# Patient Record
Sex: Female | Born: 1995 | Race: Black or African American | Hispanic: No | Marital: Single | Smoking: Current every day smoker
Health system: Southern US, Community
[De-identification: ages and names within clinical notes are randomized; demographics above are authoritative.]

## PROBLEM LIST (undated history)

## (undated) ENCOUNTER — Emergency Department (HOSPITAL_COMMUNITY): Payer: Medicaid Other

## (undated) DIAGNOSIS — N63 Unspecified lump in unspecified breast: Secondary | ICD-10-CM

## (undated) DIAGNOSIS — O139 Gestational [pregnancy-induced] hypertension without significant proteinuria, unspecified trimester: Secondary | ICD-10-CM

## (undated) DIAGNOSIS — F32A Depression, unspecified: Secondary | ICD-10-CM

## (undated) DIAGNOSIS — N390 Urinary tract infection, site not specified: Secondary | ICD-10-CM

## (undated) DIAGNOSIS — I10 Essential (primary) hypertension: Secondary | ICD-10-CM

## (undated) HISTORY — PX: NO PAST SURGERIES: SHX2092

## (undated) HISTORY — DX: Unspecified lump in unspecified breast: N63.0

---
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
## 2019-04-08 NOTE — L&D Delivery Note (Signed)
OB/GYN Faculty Practice Delivery Note  Zoraida Havrilla is a 24 y.o. [id number] s/p NSVD at [redacted]w[redacted]d. She was admitted for SROM @ 0230/SOOL.   ROM: 7h 69m with clear fluid GBS Status: Negative Maximum Maternal Temperature: 98.2 degrees Fahrenheit   Labor Progress: . Progressing normally, without augmentation  Delivery Date/Time: 03/19/20 @ 1001 Delivery: Called to room and patient was complete and pushing. Head delivered ROA. No nuchal cord present. Shoulder and body delivered in usual fashion. Infant with spontaneous cry, placed on mother's abdomen, dried and stimulated. Cord clamped x 2 after 1-minute delay, and cut by patient's sister under my direct supervision. Cord blood drawn. Placenta delivered spontaneously with gentle cord traction. Fundus firm with massage and Pitocin. Additional fentanyl given after delivery of placenta for pain control and to be able to reassess perineum. Labia, perineum, vagina, and cervix were inspected, first degree perineal laceration appreciated but was hemostatic and thus not repaired, no other tears appreciated. Excellent hemostasis achieved, infant left skin to skin with mother.  Placenta: complete, three vessel cord appreciated Complications: none Lacerations: First degree perineal laceration, hemostatic, thus not repaired  EBL: 375 mL Analgesia: PRN fentanyl  Postpartum Planning [x ] message to sent to schedule follow-up on 03/19/20 by myself [x ] vaccines - Tdap received, flu declined on 02/20/20, COVID declined, and rubella immune  Infant: boy  APGARs 8,9 at 1 and 5 minutes, respectively  weight pending  Burley Saver, MD [facility] [employer], [place] [facility]

## 2019-07-31 ENCOUNTER — Other Ambulatory Visit: Payer: Self-pay

## 2019-07-31 ENCOUNTER — Encounter (HOSPITAL_COMMUNITY): Payer: Self-pay

## 2019-07-31 DIAGNOSIS — B9689 Other specified bacterial agents as the cause of diseases classified elsewhere: Secondary | ICD-10-CM | POA: Diagnosis not present

## 2019-07-31 DIAGNOSIS — Z3A01 Less than 8 weeks gestation of pregnancy: Secondary | ICD-10-CM | POA: Diagnosis not present

## 2019-07-31 DIAGNOSIS — Z3491 Encounter for supervision of normal pregnancy, unspecified, first trimester: Secondary | ICD-10-CM | POA: Diagnosis not present

## 2019-07-31 DIAGNOSIS — N760 Acute vaginitis: Secondary | ICD-10-CM | POA: Diagnosis not present

## 2019-07-31 DIAGNOSIS — O23591 Infection of other part of genital tract in pregnancy, first trimester: Secondary | ICD-10-CM | POA: Insufficient documentation

## 2019-07-31 LAB — CBC
HCT: 42.9 % (ref 36.0–46.0)
Hemoglobin: 14.2 g/dL (ref 12.0–15.0)
MCH: 31.5 pg (ref 26.0–34.0)
MCHC: 33.1 g/dL (ref 30.0–36.0)
MCV: 95.1 fL (ref 80.0–100.0)
Platelets: 289 10*3/uL (ref 150–400)
RBC: 4.51 MIL/uL (ref 3.87–5.11)
RDW: 12.3 % (ref 11.5–15.5)
WBC: 5.4 10*3/uL (ref 4.0–10.5)
nRBC: 0 % (ref 0.0–0.2)

## 2019-07-31 MED ORDER — SODIUM CHLORIDE 0.9% FLUSH: 3.0000 mL | Freq: Once | INTRAVENOUS | Status: DC

## 2019-07-31 NOTE — ED Triage Notes (Signed)
Pt reports that she is pregnant, but unsure how far along. She complains of abdominal cramping x2 days. Denies bleeding, but reports a malodorous discharge. She is unsure of d/c color. She reports recent unprotected sex with someone that she recently got an STD from. Unsure which STD. Hx of miscarriage in Dec. 2020.

## 2019-08-01 ENCOUNTER — Emergency Department (HOSPITAL_COMMUNITY): Payer: Medicaid - Out of State

## 2019-08-01 ENCOUNTER — Emergency Department (HOSPITAL_COMMUNITY)
Admission: EM | Admit: 2019-08-01 | Discharge: 2019-08-01 | Disposition: A | Discharge status: Home / Self Care | Payer: Medicaid - Out of State | Attending: Emergency Medicine | Admitting: Emergency Medicine

## 2019-08-01 DIAGNOSIS — Z3A01 Less than 8 weeks gestation of pregnancy: Secondary | ICD-10-CM

## 2019-08-01 DIAGNOSIS — B9689 Other specified bacterial agents as the cause of diseases classified elsewhere: Secondary | ICD-10-CM

## 2019-08-01 DIAGNOSIS — R102 Pelvic and perineal pain: Secondary | ICD-10-CM

## 2019-08-01 DIAGNOSIS — N898 Other specified noninflammatory disorders of vagina: Secondary | ICD-10-CM

## 2019-08-01 LAB — URINALYSIS, ROUTINE W REFLEX MICROSCOPIC
Bilirubin Urine: NEGATIVE
Glucose, UA: NEGATIVE mg/dL
Hgb urine dipstick: NEGATIVE
Ketones, ur: NEGATIVE mg/dL
Leukocytes,Ua: NEGATIVE
Nitrite: NEGATIVE
Protein, ur: NEGATIVE mg/dL
Specific Gravity, Urine: 1.018 (ref 1.005–1.030)
pH: 7 (ref 5.0–8.0)

## 2019-08-01 LAB — COMPREHENSIVE METABOLIC PANEL
ALT: 14 U/L (ref 0–44)
AST: 18 U/L (ref 15–41)
Albumin: 3.5 g/dL (ref 3.5–5.0)
Alkaline Phosphatase: 48 U/L (ref 38–126)
Anion gap: 7 (ref 5–15)
BUN: 8 mg/dL (ref 6–20)
CO2: 23 mmol/L (ref 22–32)
Calcium: 9 mg/dL (ref 8.9–10.3)
Chloride: 107 mmol/L (ref 98–111)
Creatinine, Ser: 0.71 mg/dL (ref 0.44–1.00)
GFR calc Af Amer: >60 mL/min (ref >60)
GFR calc non Af Amer: >60 mL/min (ref >60)
Glucose, Bld: 96 mg/dL (ref 70–99)
Potassium: 3.5 mmol/L (ref 3.5–5.1)
Sodium: 137 mmol/L (ref 135–145)
Total Bilirubin: 0.6 mg/dL (ref 0.3–1.2)
Total Protein: 6.5 g/dL (ref 6.5–8.1)

## 2019-08-01 LAB — LIPASE, BLOOD: Lipase: 33 U/L (ref 11–51)

## 2019-08-01 LAB — WET PREP, GENITAL
Sperm: NONE SEEN
Trich, Wet Prep: NONE SEEN
Yeast Wet Prep HPF POC: NONE SEEN

## 2019-08-01 LAB — I-STAT BETA HCG BLOOD, ED (MC, WL, AP ONLY): I-stat hCG, quantitative: >2000 m[IU]/mL — ABNORMAL HIGH (ref <5)

## 2019-08-01 LAB — HCG, QUANTITATIVE, PREGNANCY: hCG, Beta Chain, Quant, S: 14861 m[IU]/mL — ABNORMAL HIGH (ref <5)

## 2019-08-01 MED ORDER — METRONIDAZOLE 500 MG PO TABS
500.0000 mg | ORAL_TABLET | Freq: Two times a day (BID) | ORAL | 0 refills | Status: DC
Start: 2019-08-01 — End: 2020-03-20

## 2019-08-01 MED ORDER — CEFTRIAXONE SODIUM 1 G IJ SOLR
500.0000 mg | Freq: Once | INTRAMUSCULAR | Status: AC
  Administered 2019-08-01: 500 mg via INTRAMUSCULAR
  Filled 2019-08-01: qty 10

## 2019-08-01 MED ORDER — STERILE WATER FOR INJECTION IJ SOLN
INTRAMUSCULAR | Status: AC
  Administered 2019-08-01: 2.1 mL
  Filled 2019-08-01: qty 10

## 2019-08-01 MED ORDER — DOXYCYCLINE HYCLATE 100 MG PO CAPS
100.0000 mg | ORAL_CAPSULE | Freq: Two times a day (BID) | ORAL | 0 refills | Status: DC
Start: 2019-08-01 — End: 2020-03-20

## 2019-08-01 NOTE — ED Notes (Signed)
Pelvic exam set up and ready at bedside 

## 2019-08-01 NOTE — Discharge Instructions (Addendum)
Take Doxycycline twice a day for 2 weeks for Chlamydia Take Flagyl twice a day for one week for one week for bacterial vaginosis Do not have sex for 2 weeks Have all partners tested and treated Please follow up with OBGYN for prenatal care

## 2019-08-01 NOTE — ED Provider Notes (Signed)
[facility]-EMERGENCY DEPT Provider Note   CSN: [id number] Arrival date & time: 07/31/19  2258   History Chief Complaint  Patient presents with  . Abdominal Cramping    Jamie [name] is a 24 y.o. G34P2010 female who presents with pelvic cramping.  Patient states that she had an STD couple weeks ago and was diagnosed with chlamydia in [place].  She is also pregnant but not sure how far along she is.  She estimates that her last menstrual period was about a month ago.  She was treated for chlamydia but had sex with the same partner who gave her chlamydia and he had not been treated.  She states that she has pelvic cramping that shoots down from the mid abdomen to the pelvic area.  She reports associated vaginal discharge with an odor.  She denies fever, or chills.  She is having nausea but no vomiting.  She denies any urinary symptoms.  She reportedly had a pelvic ultrasound while in [place] but states that she was told that it was too early to see an IUP  HPI     History reviewed. No pertinent past medical history.  There are no problems to display for this patient.    The histories are not reviewed yet. Please review them in the "History" navigator section and refresh this SmartLink.   OB History   No obstetric history on file.     History reviewed. No pertinent family history.  Social History   Tobacco Use  . Smoking status: Not on file  Substance Use Topics  . Alcohol use: Not on file  . Drug use: Not on file    Home Medications Prior to Admission medications   Not on File    Allergies    Patient has no known allergies.  Review of Systems   Review of Systems  Constitutional: Negative for chills and fever.  Gastrointestinal: Positive for nausea. Negative for abdominal pain and vomiting.  Genitourinary: Positive for pelvic pain and vaginal discharge. Negative for difficulty urinating, dysuria, flank pain and vaginal bleeding.  All other  systems reviewed and are negative.   Physical Exam Updated Vital Signs BP 125/61 (BP Location: Right Arm)   Pulse 97   Temp 98.4 F (36.9 C) (Oral)   Resp 18   Ht 5\' 9"  (1.753 m)   SpO2 100%   Physical Exam Vitals and nursing note reviewed.  Constitutional:      General: She is not in acute distress.    Appearance: Normal appearance. She is well-developed. She is not ill-appearing.  HENT:     Head: Normocephalic and atraumatic.  Eyes:     General: No scleral icterus.       Right eye: No discharge.        Left eye: No discharge.     Conjunctiva/sclera: Conjunctivae normal.     Pupils: Pupils are equal, round, and reactive to light.  Cardiovascular:     Rate and Rhythm: Normal rate and regular rhythm.  Pulmonary:     Effort: Pulmonary effort is normal. No respiratory distress.     Breath sounds: Normal breath sounds.  Abdominal:     General: There is no distension.     Palpations: Abdomen is soft.     Tenderness: There is no abdominal tenderness.  Genitourinary:    Comments: Pelvic: No inguinal lymphadenopathy or inguinal hernia noted. Normal external genitalia. No pain with speculum insertion. Closed cervical os with normal appearance - no rash or  lesions. Copious yellowish-white discharge coming from cervix. On bimanual examination no CMT but there is mild left sided adnexal tenderness. Chaperone [facility], RN) present during exam.   Musculoskeletal:     Cervical back: Normal range of motion.  Skin:    General: Skin is warm and dry.  Neurological:     Mental Status: She is alert and oriented to person, place, and time.  Psychiatric:        Behavior: Behavior normal.     ED Results / Procedures / Treatments   Labs (all labs ordered are listed, but only abnormal results are displayed) Labs Reviewed  WET PREP, GENITAL - Abnormal; Notable for the following components:      Result Value   Clue Cells Wet Prep HPF POC PRESENT (*)    WBC, Wet Prep HPF POC FEW (*)    All  other components within normal limits  HCG, QUANTITATIVE, PREGNANCY - Abnormal; Notable for the following components:   hCG, Beta Chain, Quant, S 14,861 (*)    All other components within normal limits  I-STAT BETA HCG BLOOD, ED ([facility], [facility], [facility] ONLY) - Abnormal; Notable for the following components:   I-stat hCG, quantitative >2,000.0 (*)    All other components within normal limits  LIPASE, BLOOD  COMPREHENSIVE METABOLIC PANEL  CBC  URINALYSIS, ROUTINE W REFLEX MICROSCOPIC  GC/CHLAMYDIA PROBE AMP ([place]) NOT AT [facility]    EKG None  Radiology US OB LESS THAN 14 WEEKS WITH OB TRANSVAGINAL  Result Date: 08/01/2019 CLINICAL DATA:  Pelvic pain EXAM: OBSTETRIC <14 WK [place] AND TRANSVAGINAL OB US TECHNIQUE: Both transabdominal and transvaginal ultrasound examinations were performed for complete evaluation of the gestation as well as the maternal uterus, adnexal regions, and pelvic cul-de-sac. Transvaginal technique was performed to assess early pregnancy. COMPARISON:  None. FINDINGS: Intrauterine gestational sac: Single Yolk sac:  Visualized. Embryo:  Visualized. Cardiac Activity: Yes Heart Rate: 102 bpm CRL:  2.2 mm   5  w   5 d                  [place] EDC: 03/28/2020 Subchorionic hemorrhage:  None visualized. Maternal uterus/adnexae: Normal appearing ovaries.  No free fluid. IMPRESSION: Single live intrauterine pregnancy measuring 5 weeks 5 days Electronically Signed   By: Jonna Clark M.D.   On: 08/01/2019 06:44    Procedures Procedures (including critical care time)  Medications Ordered in ED Medications  cefTRIAXone (ROCEPHIN) injection 500 mg (500 mg Intramuscular Given 08/01/19 0716)  sterile water (preservative free) injection (2.1 mLs  Given 08/01/19 0716)    ED Course  I have reviewed the triage vital signs and the nursing notes.  Pertinent labs & imaging results that were available during my care of the patient were reviewed by me and considered in my medical decision making (see chart  for details).  24 year old female presents with pelvic pain and vaginal discharge. She is unsure of her LMP but estimates it is around a month ago. Pregnancy test here is positive. She has concern for STD since she was just diagnosed with Chlamydia, treated, and had sex with her partner who wasn't treated. Her vitals are normal. Abdomen is soft and non-tender. Pelvic exam was performed and she had copious white discharge. There is minimal L adnexal tenderness. Will obtain formal quant and TVUS to r/o ectopic.  Hcg is 14K which is rising appropriately. Other labs are normal. [place] confirms IUP which is about 5 weeks and 5 days. Wet prep  shows clue cells. Will treat empirically with Rocephin, 2 weeks of Doxy for PID, and flagyl for BV. She was given info for [facility] clinic for prenatal care.  MDM Rules/Calculators/A&P                       Final Clinical Impression(s) / ED Diagnoses Final diagnoses:  Vaginal discharge  BV (bacterial vaginosis)  Less than [redacted] weeks gestation of pregnancy    Rx / DC Orders ED Discharge Orders    None       Ellianne, Gowen 08/01/19 2234    Palumbo, April, MD 08/03/19 2334

## 2019-08-02 LAB — GC/CHLAMYDIA PROBE AMP (~~LOC~~) NOT AT ARMC
Chlamydia: NEGATIVE
Comment: NEGATIVE
Comment: NORMAL
Neisseria Gonorrhea: NEGATIVE

## 2019-09-17 ENCOUNTER — Emergency Department (HOSPITAL_COMMUNITY)
Admission: EM | Admit: 2019-09-17 | Discharge: 2019-09-17 | Disposition: A | Discharge status: Home / Self Care | Payer: Medicaid Other | Attending: Emergency Medicine | Admitting: Emergency Medicine

## 2019-09-17 ENCOUNTER — Other Ambulatory Visit: Payer: Self-pay

## 2019-09-17 ENCOUNTER — Encounter (HOSPITAL_COMMUNITY): Payer: Self-pay

## 2019-09-17 DIAGNOSIS — F17210 Nicotine dependence, cigarettes, uncomplicated: Secondary | ICD-10-CM | POA: Diagnosis not present

## 2019-09-17 DIAGNOSIS — Z3490 Encounter for supervision of normal pregnancy, unspecified, unspecified trimester: Secondary | ICD-10-CM | POA: Insufficient documentation

## 2019-09-17 NOTE — ED Provider Notes (Signed)
[facility]-EMERGENCY DEPT Provider Note   CSN: [id number] Arrival date & time: 09/17/19  1334     History No chief complaint on file.   Jamie [name] is a 24 y.o. female.  HPI   23yF with pregnancy and unsure of how to proceed. Positive pregnancy test on 08/01/19. She thinks LMP was possibly at the end of February but she is unsure. Thinks she had miscarriage in December but never sought care for this. Two prior pregnancies in [place] and reports pre-eclampsia with both. Was not any meds after delivery. Smoker. Reports that she is otherwise healthy. Mild intermittent nausea and dizziness. None currently.   History reviewed. No pertinent past medical history.  There are no problems to display for this patient.   History reviewed. No pertinent surgical history.   OB History   No obstetric history on file.     Family History  Problem Relation Age of Onset  . Hypertension Mother     Social History   Tobacco Use  . Smoking status: Current Every Day Smoker    Packs/day: 0.15    Types: Cigarettes  . Smokeless tobacco: Never Used  Vaping Use  . Vaping Use: Never used  Substance Use Topics  . Alcohol use: Never  . Drug use: Never    Home Medications Prior to Admission medications   Medication Sig Start Date End Date Taking? Authorizing Provider  acetaminophen (TYLENOL) 325 MG tablet Take 650 mg by mouth every 6 (six) hours as needed for mild pain, moderate pain or headache.    [provider]  doxycycline (VIBRAMYCIN) 100 MG capsule Take 1 capsule (100 mg total) by mouth 2 (two) times daily. 08/01/19   Bethel Born, PA-C  metroNIDAZOLE (FLAGYL) 500 MG tablet Take 1 tablet (500 mg total) by mouth 2 (two) times daily. 08/01/19   Bethel Born, PA-C    Allergies    Patient has no known allergies.  Review of Systems   Review of Systems All systems reviewed and negative, other than as noted in HPI.  Physical Exam Updated Vital  Signs BP 107/73   Pulse 76   Temp 98 F (36.7 C) (Oral)   Resp 16   Ht 5\' 9"  (1.753 m)   Wt 55.9 kg   SpO2 100%   BMI 18.19 kg/m   Physical Exam Vitals and nursing note reviewed.  Constitutional:      General: She is not in acute distress.    Appearance: She is well-developed.  HENT:     Head: Normocephalic and atraumatic.  Eyes:     General:        Right eye: No discharge.        Left eye: No discharge.     Conjunctiva/sclera: Conjunctivae normal.  Cardiovascular:     Rate and Rhythm: Normal rate and regular rhythm.     Heart sounds: Normal heart sounds. No murmur heard.  No friction rub. No gallop.   Pulmonary:     Effort: Pulmonary effort is normal. No respiratory distress.     Breath sounds: Normal breath sounds.  Abdominal:     General: There is no distension.     Palpations: Abdomen is soft.     Tenderness: There is no abdominal tenderness.  Musculoskeletal:        General: No tenderness.     Cervical back: Neck supple.  Skin:    General: Skin is warm and dry.  Neurological:     Mental Status:  She is alert.  Psychiatric:        Behavior: Behavior normal.        Thought Content: Thought content normal.     ED Results / Procedures / Treatments   Labs (all labs ordered are listed, but only abnormal results are displayed) Labs Reviewed - No data to display  EKG None  Radiology No results found.  Procedures Procedures (including critical care time)  Medications Ordered in ED Medications - No data to display  ED Course  I have reviewed the triage vital signs and the nursing notes.  Pertinent labs & imaging results that were available during my care of the patient were reviewed by me and considered in my medical decision making (see chart for details).    MDM Rules/Calculators/A&P                          23yF with likely first trimester pregnancy. LMP possibly at the end of February. Uterus not palpably enlarged in this thin patient. Really no  other acute complaints. Mostly unsure of how to proceed. Advised to stop smoking. Start pre-natal vitamins. She reports she has medicaid. Advised to follow-up with Health Dept.   Final Clinical Impression(s) / ED Diagnoses Final diagnoses:  Pregnancy, unspecified gestational age    Rx / [place] Orders ED Discharge Orders    None       Virgel Manifold, MD 09/17/19 [phone]

## 2019-09-17 NOTE — ED Triage Notes (Signed)
Patient reports that she had a positive pregnancy test on 08/01/19. patient states she has not had any prenatal care. Patient states, "I do not know how far along I am and I do not know how to get medical care."

## 2019-09-17 NOTE — Discharge Instructions (Addendum)
Start taking pre-natal vitamins. Follow-up with the [facility] Department.

## 2019-11-03 LAB — OB RESULTS CONSOLE ABO/RH: RH Type: POSITIVE

## 2019-11-03 LAB — SICKLE CELL SCREEN: Sickle Cell Screen: NORMAL

## 2019-11-03 LAB — OB RESULTS CONSOLE VARICELLA ZOSTER ANTIBODY, IGG: Varicella: IMMUNE

## 2019-11-03 LAB — CYSTIC FIBROSIS DIAGNOSTIC STUDY: Interpretation-CFDNA:: NEGATIVE

## 2019-11-03 LAB — HEPATITIS C ANTIBODY: HCV Ab: NEGATIVE

## 2019-11-03 LAB — OB RESULTS CONSOLE ANTIBODY SCREEN: Antibody Screen: NEGATIVE

## 2019-11-03 LAB — OB RESULTS CONSOLE GC/CHLAMYDIA
Chlamydia: NEGATIVE
Gonorrhea: POSITIVE

## 2019-11-03 LAB — OB RESULTS CONSOLE RUBELLA ANTIBODY, IGM: Rubella: IMMUNE

## 2019-11-03 LAB — OB RESULTS CONSOLE HIV ANTIBODY (ROUTINE TESTING): HIV: NONREACTIVE

## 2019-11-03 LAB — OB RESULTS CONSOLE RPR: RPR: NONREACTIVE

## 2019-11-08 LAB — CYTOLOGY - PAP: Pap: ABNORMAL — AB

## 2020-01-06 LAB — OB RESULTS CONSOLE GC/CHLAMYDIA
Chlamydia: NEGATIVE
Gonorrhea: NEGATIVE

## 2020-01-06 LAB — OB RESULTS CONSOLE HEPATITIS B SURFACE ANTIGEN: Hepatitis B Surface Ag: NEGATIVE

## 2020-01-06 LAB — OB RESULTS CONSOLE RPR: RPR: NONREACTIVE

## 2020-01-06 LAB — OB RESULTS CONSOLE HGB/HCT, BLOOD
HCT: 36 (ref 29–41)
Hemoglobin: 11.9

## 2020-01-06 LAB — OB RESULTS CONSOLE HIV ANTIBODY (ROUTINE TESTING): HIV: NONREACTIVE

## 2020-01-06 LAB — GLUCOSE TOLERANCE, 1 HOUR: Glucose, 1 Hour GTT: 67

## 2020-02-13 ENCOUNTER — Encounter: Payer: Self-pay | Admitting: *Deleted

## 2020-02-14 ENCOUNTER — Encounter: Payer: Self-pay | Admitting: General Practice

## 2020-02-14 DIAGNOSIS — O0990 Supervision of high risk pregnancy, unspecified, unspecified trimester: Secondary | ICD-10-CM | POA: Insufficient documentation

## 2020-02-14 DIAGNOSIS — O365990 Maternal care for other known or suspected poor fetal growth, unspecified trimester, not applicable or unspecified: Secondary | ICD-10-CM | POA: Insufficient documentation

## 2020-02-14 DIAGNOSIS — O365930 Maternal care for other known or suspected poor fetal growth, third trimester, not applicable or unspecified: Secondary | ICD-10-CM

## 2020-02-20 ENCOUNTER — Ambulatory Visit (INDEPENDENT_AMBULATORY_CARE_PROVIDER_SITE_OTHER): Payer: Medicaid Other | Admitting: Obstetrics and Gynecology

## 2020-02-20 ENCOUNTER — Other Ambulatory Visit: Payer: Self-pay

## 2020-02-20 VITALS — BP 108/71 | HR 88 | Wt 149.9 lb

## 2020-02-20 DIAGNOSIS — F419 Anxiety disorder, unspecified: Secondary | ICD-10-CM

## 2020-02-20 DIAGNOSIS — O0990 Supervision of high risk pregnancy, unspecified, unspecified trimester: Secondary | ICD-10-CM | POA: Diagnosis not present

## 2020-02-20 DIAGNOSIS — O365930 Maternal care for other known or suspected poor fetal growth, third trimester, not applicable or unspecified: Secondary | ICD-10-CM

## 2020-02-20 DIAGNOSIS — Z3A34 34 weeks gestation of pregnancy: Secondary | ICD-10-CM

## 2020-02-20 NOTE — Patient Instructions (Addendum)
AREA PEDIATRIC/FAMILY PRACTICE PHYSICIANS  [place] ([address]) . [facility] o Chambliss, MD; Eniola, MD; Hale, MD; Hensel, MD; McDiarmid, MD; McIntyer, MD; Neal, MD; Walden, MD o [address] [place], [place] [address] o [phone] o Mon-Fri 8:30-12:30, 1:30-5:00 o Providers come to see babies at [facility] o Accepting Medicaid . [facility] o Limited providers who accept newborns: Koirala, MD; Morrow, MD; Wolters, MD o [address], [place], [place] [address] o [phone] o Mon-Fri 8:00-5:30 o Babies seen by providers at [facility] o Does NOT accept Medicaid o Please call early in hospitalization for appointment (limited availability)  . [facility] o Mulberry, MD o [address] [place], [place] [address] o [phone] o Mon, Tue, Thur, Fri 8:30-5:00, Wed 10:00-7:00 (closed 1-2pm) o Babies seen by [facility] providers o Accepting Medicaid . [facility] - Pediatrician o Rubin, MD o [address], [place], [place] [address] o [phone] o Mon-Fri 8:30-5:00, Sat 8:30-12:00 o Provider comes to see babies at [facility] o Accepting Medicaid o Must have been referred from current patients or contacted office prior to delivery . [facility] ([facility]) o Brown, MD; Chandler, MD; Ettefagh, MD; Grant, MD; Lester, MD; McCormick, MD; McQueen, MD; Prose, MD; Simha, MD; Stanley, MD; Stryffeler, NP; Tebben, NP o [address], [place], [place] [address] o [phone] o Mon, Tue, Thur, Fri 8:30-5:30, Wed 9:30-5:30, Sat 8:30-12:30 o Babies seen by [facility] providers o Accepting Medicaid o Only accepting infants of first-time parents or siblings of current patients o Hospital discharge coordinator will make follow-up appointment . Jack Amos o [address],  [place], [place]  [address] o [phone]   Fax - [phone] . [facility] o [address], [place], [place]  [address] o Phone - [phone]   Fax - [phone] . Shilpa Gosrani o [address], [place], [place]  [address] o [phone]  [place] ([address]) . [facility] o Bates, MD; [place], MD; Cooper, Cox, MD; MD; Davis, MD; Dovico, MD; Ettefaugh, MD; Little, MD; Lowe, MD; Keiffer, MD; Melvin, MD; Sumner, MD; Williams, MD o [address], [place], [place] [address] o [phone] o Mon-Fri 8:30-5:00 (extended evenings Mon-Thur as needed), Sat-Sun 10:00-1:00 o Providers come to see babies at [facility] o Accepting Medicaid for families of first-time babies and families with all children in the household age 3 and under. Must register with office prior to making appointment (M-F only). . [facility] o Henson, NP; Knapp, MD; Lalonde, MD; Tysinger, PA o [address] [place], [place] [address] o [phone] o Mon-Fri 8:00-5:00 o Babies seen by providers at [facility] o Does NOT accept Medicaid/Commercial Insurance Only . [facility] ([facility]  o Artis, MD; Barnes, MD; Bratton, MD; Coccaro, MD; Lockett Prusinski, MD; Kramer, MD; Marshall, MD; Netherton, MD; Poleto, MD; Skinner, MD o [address] [place], [place] [address] o [phone] o Mon-Fri 8:30-5:30, Sat (Oct.-Mar.) 9:00-1:00 o Babies seen by providers at [facility] o Accepting Medicaid  [place] ([address]) . [facility] o Reid, MD; Warner, MD o [address], [place],  [address] o [phone] o Mon-Fri 8:30-5:00, Sat 8:30-12:00 o Providers come to see babies at [facility] o Does NOT accept Medicaid . [facility] o Becker, PA; Hagler, MD; Scifres, PA; Sun, MD; Swayne, MD o [address],  [place], [place] [address] o [phone] o Mon-Fri 8:00-5:00 o Babies seen by providers at [facility] o Does NOT accept Medicaid o Only accepting babies of parents who are patients o Please call early in hospitalization for appointment (limited availability) . [facility] o Clark, MD; Frye, MD; Kelleher, MD; Mack, NP; Miller, MD; O'Keller, MD; Patterson, NP; Pudlo, MD; Puzio, MD; Thomas, MD; Tucker, MD; Twiselton, MD o [address], [place], [place] [address] o [phone] o Mon-Fri 8:00-5:00, Sat 9:00-12:00 o Providers come to see babies at [facility] o Does NOT accept Medicaid  [place] ([address]) . [facility] o Limited providers accepting new patients: Brake, NP; Wharton, PA o [address], [place], [place] [address] o [phone] o Mon-Fri 8:00-5:00 o Babies seen by providers at [facility] o Does NOT accept Medicaid o Only accepting babies of parents who are patients o Please call early in hospitalization for appointment (limited availability) . [facility] o Gay, MD; Quinlan, MD o [address] [place], [place] [address] o [phone] (press 1 to schedule appointment) o Mon-Fri 8:00-5:00 o Providers come to see babies at [facility] o Does NOT accept Medicaid . [facility] o Mazer, MD o [address] [place], [place] [address] o [phone] o Mon-Fri 8:30-5:00 (lunch 12:30-1:00), extended hours by appointment only Wed 5:00-6:30 o Babies seen by [facility] providers o Accepting Medicaid . [facility] o Banks, MD; Jordan, MD; Koberlein, MD o [address], [place], [place] [address] o [phone] o Mon-Fri 8:00-5:00 o Babies seen by [facility] providers o Does NOT accept Medicaid . [facility] o Parker, MD; Hunter, MD; Wallace, DO o [address] [place], [place]  [address] o [phone] o Mon-Fri 8:00-5:00 o Babies seen by [facility] providers o Does NOT accept Medicaid . [facility] o Brandon, PA; Brecken, PA; Christy, NP; Dees, MD; DeClaire, MD; DeWeese, MD; Hansen, NP; Mills, NP; Parrish, NP; Smoot, NP; Summer, MD; Vapne, MD o [address] [place], [place] [address] o [phone] o Mon-Fri 8:30-5:00, Sat 10:00-1:00 o Providers come to see babies at [facility] o Does NOT accept Medicaid o Free prenatal information session Tuesdays at 4:45pm . [facility] o Bouska, MD; Gordon, PA; Jeffery, PA; Weber, PA o [address] [place] [address] o [phone] o Mon-Fri 7:30-5:30 o Babies seen by [facility] providers . [facility] o [address], [place], [place], [place]  [address] o [phone]   Fax - [phone]  [place] ([phone]) . [facility] o Reese, MD o [address] [place], [place] [address] o [phone] o Mon-Thur 8:00-6:00 o Providers come to see babies at [facility] o Accepting Medicaid . [facility] o Anderson, NP; Badger, MD; Beal, PA; Spencer, PA o [address] [place],  [address] o [phone] o Mon-Thur 7:30-7:30, Fri 7:30-4:30 o Babies seen by [facility] providers o Accepting Medicaid . [facility] o Agbuya, MD; Klett, NP; Romgoolam, MD o [address], ,  [address] o [phone] o Mon-Fri 8:30-5:00, Sat 8:30-12:00 o Providers come to see babies at [facility] o Accepting Medicaid o Must have "Meet & Greet" appointment at office prior to delivery . [facility]  ([facility] ) o McCord,   MD; Juleen China, MD; Clydene Laming, [address], [place], [place] [address] o [phone] o Mon-Wed 8:00-6:00, Thur-Fri 8:00-5:00, Sat 9:00-12:00 o Providers come to  see babies at [facility] o Does NOT accept Medicaid o Only accepting siblings of current patients . [facility] [place], [place], [place], [place]  [address] o [phone]   Fax [phone] . [facility] [place] [address], [place], [place]  [address] o [phone]   Fax - Morton Gorman [phone] & [phone]) . Therapist, music at [place], DO; [place], [address] [place], [place] [address] o [phone] o Mon-Fri 7:00-5:00 o Babies seen by [facility] providers o Does NOT accept Medicaid . [place], MD; [place], [place]; [place], [place], [place], [place] [address] o [phone] o Mon-Fri 8:00-5:00 o Babies seen by [facility] providers o Accepting Medicaid . Lamont, MD; [place], [place]; [place], NP; [place], [place], [place], [place] [address] o [phone] o Mon-Fri 8:00-5:00 o Babies seen by providers at [facility] [place] [phone]) . [facility] [place], [place] o [address] [place], [place] [address] o [phone] o Mon-Fri 8:00-5:00 o Babies seen by [facility] providers o Does NOT accept Medicaid o Limited availability, please call early in hospitalization to schedule follow-up . [facility] Leilani Merl, PA; Maisie Fus, MD; [place], MD; [place], [place]; Jeannine Kitten, MD; [place], [place] [facility] [address], [place], [place] [address] o [phone] o Mon-Fri 8:30-5:00, Sat 9:00-12:00 o Babies seen by providers at [facility] o Accepting Medicaid o Please register online then schedule online or call office o [email or web address] . [place] [facility]  [place]) Kristian Covey, NP; Dwyane Dee, MD; Leonidas Romberg, PA o [address] [place], [place], [place] [address] o [phone] o Mon-Fri 8:00-5:00 o Babies seen by providers at [facility] o Accepting Medicaid . [place] [facility]) Dairl Ponder, MD; Rayvon Char, NP; Melina Modena, MD o [address] [place], [place], [place] [address] o [phone] o Mon-Fri 8:00-5:30, Sat&Sun by appointment (phones open at 8:30) o Babies seen by [facility] providers o Accepting Medicaid o Must be a first-time baby or sibling of current patient . [place], [address], [place], [place]  [address] o [phone]   Fax - [phone]  [place] [phone] & [phone]) . [place], [place]; [place], [place]; Benjamine Mola, MD; [place], [place]; Harrell Lark, MD o [address] [place], [place] [address] o [phone] o Mon-Thur 8:00-7:00, Fri 8:00-5:00, Sat 8:00-12:00, Sun 9:00-12:00 o Babies seen by [facility] providers o Accepting Medicaid . [facility], MD; Ruthann Cancer, MD; [facility], MD o [address], [place], [place] [address] o [phone] o Mon-Thur 8:00-5:00 o Babies seen by providers at [facility] o Accepting Medicaid . [facility] at [place], MD; Coe[place]Goins, MD; Amedeo Plenty, MD; Bobby Rumpf, MD; List, MD; Lavonia Drafts, MD; Ruthann Cancer, MD; Selinda Eon, MD; Audie Box, MD; Jim Like, MD; Christie Nottingham, MD; Hubbard Hartshorn, MD; Modena Nunnery, MD o [address] [place], [place]  [address] o [phone] o Mon-Fri 8:00-5:30, Sat (Oct.-Mar.) 9:00-1:00 o Babies seen by providers at [facility] o Accepting Medicaid o Must fill out new patient packet, available online at [email or web address] . [facility]) Simone Curia, NP; Tiburcio Pea, NP; Tresa Endo, NP; Whitney Post, MD;  [place], [place]; Hennie Duos, MD; Wynne Dust, MD; Kavin Leech, NP o [address], [place], [place] [address] o [phone] o Mon-Thur 8:00-5:30, Fri 8:00-5:00 o Babies seen by providers at [facility] o Accepting [facility] [phone]) . [facility] o [place], [place]; [place], MD; Tanya Nones, MD; [place], [place] o [address] [place], [place] [address] o [phone] o Mon-Fri 8:00-5:00 o Babies seen by providers at [facility] o Accepting [facility] [phone]) . [facility] at [facility] o [place], DO; Lenise Arena, MD; [place], [place] o [address], [place], [place] [address] o [phone] o Mon-Fri 8:00-5:00 o Babies seen by providers at [facility] o Does NOT accept Medicaid o Limited appointment availability, please call early in hospitalization  . Nature conservation officer at [facility] o [place], DO; Torreon, MD o [address] [place], [place] [address] o [phone] o Mon-Fri 8:00-5:00 o Babies seen by [facility] providers o Does NOT accept Medicaid . [facility] Lorrine Kin, MD; Ninetta Lights, MD; [place], [place]; [place], MD o [address], [place], [place] [address] o [phone] o Mon-Fri 8:00-5:00 o After hours clinic [facility] [place], [place] [address]) [phone] Mon-Fri 5:00-8:00, Sat 12:00-6:00, Sun 10:00-4:00 o Babies seen by [facility] providers o Accepting Medicaid . [facility] o [address] [place], [place]  [address] o [phone]   Fax - [phone]  [place] [phone]) . Nature conservation officer at [facility], MD o [address] [place], [place], [place] [address] o [phone] o Mon-Fri 8:00-5:00 o Babies seen by [facility] providers o Does NOT accept Medicaid . [facility] [place] [facility] [place] [facility] at [place]) Tomi Likens, MD o [address] [place] [address], [place], [place]  [address] o [phone] o Mon-Thur 8:00-7:00, Fri 8:00-5:00, Sat 8:00-12:00 o Babies seen by providers at [facility] o Accepting Medicaid - but does not have vaccinations in office (must be received elsewhere) o Limited availability, please call early in hospitalization  [place] ([address]) . [facility]  o Wyvonne Lenz, MD o [address] [place] [address] o [phone]  Fax [phone]  Intrauterine Growth Restriction  Intrauterine growth restriction (IUGR) is when a baby is not growing normally during pregnancy. A baby with IUGR is smaller than it should be and may weigh less than normal at birth. IUGR can result from a problem with the organ that supplies the unborn baby (fetus) with oxygen and nutrition (placenta). Usually, there is no way to prevent this type of problem. Babies with IUGR are at higher risk for early delivery and needing special (intensive) care after birth. What are the causes? The most common cause of IUGR is a problem with the placenta or umbilical cord that causes the fetus to get less oxygen or nutrition than needed. Other causes include:  The mother eating a very unhealthy diet (poor maternal nutrition).  Exposure to chemicals found in substances such as cigarettes, alcohol, and some drugs.  Some prescription medicines.  Other problems that develop in the womb (congenital birth defects).  Genetic disorders.  Infection.  Carrying more than one baby. What increases the risk? This condition is more likely to affect babies of mothers who:  Are over the age of 22 at the time of delivery.  Are younger than age 22 at the time of delivery.  Have medical conditions such as high blood pressure, preeclampsia, diabetes, heart or kidney disease, systemic lupus erythematosus, or anemia.  Live at a very high altitude during pregnancy.  Have a personal history or family history of: ? IUGR. ? A genetic disorder.  Take medicines  during pregnancy that are related to congenital disabilities.  Come into contact with infected cat feces (toxoplasmosis).  Come into contact with chickenpox (varicella) or [place] measles (rubella).  Have or are at risk of getting an infectious disease such as syphilis, HIV, or herpes.  Eat an unhealthy diet during pregnancy.  Weigh less than 100 pounds.  Have had treatments to help her have children (infertility treatments).  Use tobacco, drugs, or alcohol during pregnancy. What are the signs or symptoms? IUGR does not cause many symptoms. You might notice that your baby does not move or kick very often. Also, your belly may not be as big as expected for the stage of your pregnancy. How is this diagnosed? This condition is diagnosed with physical exams and prenatal exams. You may also have:  Fundal measurements to check the size of your uterus.  An ultrasound done to measure your baby's size compared to the size of other babies at the same stage of development (gestational age). Your health care provider will monitor your baby's growth with ultrasounds throughout pregnancy. You may also have tests to find the cause of IUGR. These may include:  Amniocentesis. This is a procedure that involves passing a needle into the uterus to collect a sample of fluid that surrounds the fetus (amniotic fluid). This may be done to check for signs of infection or congenital defects.  Tests to evaluate blood flow to your baby and placenta. How is this treated? In most cases, the goal of treatment is to treat the cause of IUGR. Your health care providers will monitor your pregnancy closely and help you manage your pregnancy. If your condition is caused by a placenta problem and your baby is not getting enough blood, you may need:  Medicine to start labor and deliver your baby early (induction).  Cesarean delivery, also called a C-section. In this procedure, your baby is delivered through an incision in  your abdomen and uterus. Follow these instructions at home: Medicines  Take over-the-counter and prescription medicines only as told by your health care provider. This includes vitamins and supplements.  Make sure that your health care provider knows about and approves of all the medicines, supplements, vitamins, eye drops, and creams that you use. General instructions  Eat a healthy diet that includes fresh fruits and vegetables, lean proteins, whole grains, and calcium-rich foods such as milk, yogurt, and dark, leafy greens. Work with your health care provider or a dietitian to make sure that: ? You are getting enough nutrients. ? You are gaining enough weight.  Rest as needed. Try to get at least 8 hours of sleep every night.  Do not drink alcohol or use drugs.  Do not use any products that contain nicotine or tobacco, such as cigarettes and e-cigarettes. If you need help quitting, ask your health care provider.  Keep all follow-up visits as told by your health care provider. This is important. Get help right away if you:  Notice that your baby is moving less than usual or is not moving.  Have contractions that are 5 minutes or less apart, or that increase in frequency, intensity, or length.  Have signs and symptoms of infection, including a fever.  Have vaginal bleeding.  Have increased swelling in your legs, hands, or face.  Have vision changes, including seeing spots or having blurry or double vision.  Have a severe headache that does not go away.  Have sudden, sharp abdominal pain or low back pain.  Have an uncontrolled gush or trickle of fluid from your vagina. Summary  Intrauterine growth restriction (IUGR) is when a baby is not growing normally during pregnancy.  The most common cause of IUGR is a problem with the placenta or umbilical cord that causes the fetus to get less oxygen or nutrition than needed.  This condition is diagnosed with physical and prenatal  exams. Your health care provider will monitor your baby's growth with ultrasounds throughout pregnancy.  Make sure that your health care provider knows about and approves of all the medicines, supplements, vitamins, eye drops, and creams that you use. This information is not intended to replace advice given to you by your health care provider. Make sure you discuss any questions you have with your health care provider. Document Revised: 03/06/2017 Document Reviewed: 01/22/2017 Elsevier Patient Education  2020 Elsevier Inc.  Surgery to Prevent Pregnancy Female sterilization is surgery to prevent pregnancy. In this surgery, the fallopian tubes are either blocked or closed off. When the fallopian tubes are closed, the eggs that the ovaries release cannot enter the uterus, sperm cannot reach the eggs, and you cannot get pregnant. Sterilization is permanent. It should only be done if you are sure that you do not want to be able to have children. What are the sterilization surgery options? There are several kinds of female sterilization surgeries. They include:  Laparoscopic tubal ligation. In this surgery, the fallopian tubes are tied off, sealed with heat, or blocked with a clip, ring, or clamp. A small portion of each fallopian tube may also be removed. This surgery is done through several small cuts (incisions) with special instruments that are inserted into your abdomen.  Postpartum tubal ligation. This is also called a mini-laparotomy. This surgery is done right after childbirth or 1 or 2 days after childbirth. In this surgery, the fallopian tubes are tied off, sealed with heat, or blocked with a clip, ring, or clamp. A small portion of each fallopian tube may also be removed. The surgery is done through a single incision in the abdomen.  Tubal ligation during a C-section. In this surgery, the fallopian tubes are tied off, sealed with heat, or blocked with a clip, ring, or clamp. A small portion of  each fallopian tube may also be removed. The surgery is done at the same time as a C-section delivery. Is sterilization safe? Generally, sterilization is safe. Complications are rare. However, there are risks. They include:  Bleeding.  Infection.  Reaction to medicine used during the procedure.  Injury to surrounding organs.  Failure of the procedure. How effective is sterilization? Sterilization is nearly 100% effective, but it can fail. In rare cases, the fallopian tubes can grow back together over time. If this happens, pregnancy may be possible and you will be able to get pregnant again. Women who have had this procedure have a higher chance of having an ectopic pregnancy. An ectopic pregnancy is a pregnancy that happens outside of the uterus. This  kind of pregnancy can lead to serious bleeding if it is not treated. What are the benefits?  It is usually effective for a lifetime.  It is usually safe.  It does not have the drawbacks of other types of birth control in that your hormones are not affected. Because of this, your menstrual periods, sexual desire, and sexual performance will not be affected. What are the drawbacks?  You will need to recover and may have complications after surgery.  If you change your mind and decide that you want to have children, you may not be able to. Sterilization may be reversed, but a reversal is not always successful.  It does not provide protection against STDs (sexually transmitted diseases).  It increases the chance of having an ectopic pregnancy. Follow these instructions at home:  Keep all follow-up visits as told by your health care provider. This is important. Summary  Female sterilization is surgery to prevent pregnancy.  There are different types of female sterilization surgeries.  Sterilization may be reversed, but a reversal is not always successful.  Sterilization does not protect against STDs. This information is not  intended to replace advice given to you by your health care provider. Make sure you discuss any questions you have with your health care provider. Document Revised: 09/08/2018 Document Reviewed: 12/04/2017 Elsevier Patient Education  2020 Elsevier Inc.  Intrauterine Device Insertion An intrauterine device (IUD) is a medical device that gets inserted into the uterus to prevent pregnancy. It is a small, T-shaped device that has one or two nylon strings hanging down from it. The strings hang out of the lower part of the uterus (cervix) to allow for future IUD removal. There are two types of IUDs available:  Copper IUD. This type of IUD has copper wire wrapped around it. Copper makes the uterus and fallopian tubes produce a fluid that kills sperm. A copper IUD may last up to 10 years.  Hormone IUD. This type of IUD is made of plastic and contains the hormone progestin (synthetic progesterone). The hormone thickens mucus in the cervix and prevents sperm from entering the uterus. It also thins the uterine lining to prevent implantation of a fertilized egg. The hormone can weaken or kill the sperm that get into the uterus. A hormone IUD may last 3-5 years. Tell a health care provider about:  Any allergies you have.  All medicines you are taking, including vitamins, herbs, eye drops, creams, and over-the-counter medicines.  Any problems you or family members have had with anesthetic medicines.  Any blood disorders you have.  Any surgeries you have had.  Any medical conditions you have, including any STIs (sexually transmitted infections) you may have.  Whether you are pregnant or may be pregnant. What are the risks? Generally, this is a safe procedure. However, problems may occur, including:  Infection.  Bleeding.  Allergic reactions to medicines.  Accidental puncture (perforation) of the uterus, or damage to other structures or organs.  Accidental placement of the IUD either in the  muscle layer of the uterus (myometrium) or outside the uterus.  The IUD falling out of the uterus (expulsion). This is more common among women who have recently had a child.  Pregnancy that happens in the fallopian tube (ectopic pregnancy).  Infection of the uterus and fallopian tubes (pelvic inflammatory disease). What happens before the procedure?  Schedule the IUD insertion for when you will have your menstrual period or right after, to make sure you are not pregnant. Placement of  the IUD is better tolerated shortly after a menstrual cycle.  Follow instructions from your health care provider about eating or drinking restrictions.  Ask your health care provider about changing or stopping your regular medicines. This is especially important if you are taking diabetes medicines or blood thinners.  You may get a pain reliever to take before the procedure.  You may have tests for: ? Pregnancy. A pregnancy test involves having a urine sample taken. ? STIs. Placing an IUD in someone who has an STI can make the infection worse. ? Cervical cancer. You may have a Pap test to check for this type of cancer. This means collecting cells from your cervix to be examined under a microscope.  You may have a physical exam to determine the size and position of your uterus. The procedure may vary among health care providers and hospitals. What happens during the procedure?  A tool (speculum) will be placed in your vagina and widened so that your health care provider can see your cervix.  Medicine may be applied to your cervix to help lower your risk of infection (antiseptic medicine).  You may be given an anesthetic medicine to numb each side of your cervix (intracervical block or paracervical block). This medicine is usually given by an injection into the cervix.  A tool (uterine sound) will be inserted into your uterus to determine the length of your uterus and the direction that your uterus may be  tilted.  A slim instrument or tube (IUD inserter) that holds the IUD will be inserted into your vagina, through your cervical canal, and into your uterus.  The IUD will be placed in the uterus, and the IUD inserter will be removed.  The strings that are attached to the IUD will be trimmed so that they lie just below the cervix. The procedure may vary among health care providers and hospitals. What happens after the procedure?  You may have bleeding after the procedure. This is normal. It varies from light bleeding (spotting) for a few days to menstrual-like bleeding.  You may have cramping and pain.  You may feel dizzy or light-headed.  You may have lower back pain. Summary  An intrauterine device (IUD) is a small, T-shaped device that has one or two nylon strings hanging down from it.  Two types of IUDs are available. You may have a copper IUD or a hormone IUD.  Schedule the IUD insertion for when you will have your menstrual period or right after, to make sure you are not pregnant. Placement of the IUD is better tolerated shortly after a menstrual cycle.  You may have bleeding after the procedure. This is normal. It varies from light spotting for a few days to menstrual-like bleeding. This information is not intended to replace advice given to you by your health care provider. Make sure you discuss any questions you have with your health care provider. Document Revised: 03/06/2017 Document Reviewed: 02/13/2016 Elsevier Patient Education  2020 [employer].  Intrauterine Device Information An intrauterine device (IUD) is a medical device that is inserted in the uterus to prevent pregnancy. It is a small, T-shaped device that has one or two nylon strings hanging down from it. The strings hang out of the lower part of the uterus (cervix) to allow for future IUD removal. There are two types of IUDs available:  Hormone IUD. This type of IUD is made of plastic and contains the hormone  progestin (synthetic progesterone). A hormone IUD may last  3-5 years.  Copper IUD. This type of IUD has copper wire wrapped around it. A copper IUD may last up to 10 years. How is an IUD inserted? An IUD is inserted through the vagina and placed into the uterus with a minor medical procedure. The exact procedure for IUD insertion may vary among health care providers and hospitals. How does an IUD work? Synthetic progesterone in a hormonal IUD prevents pregnancy by:  Thickening cervical mucus to prevent sperm from entering the uterus.  Thinning the uterine lining to prevent a fertilized egg from being implanted there. Copper in a copper IUD prevents pregnancy by making the uterus and fallopian tubes produce a fluid that kills sperm. What are the advantages of an IUD? Advantages of either type of IUD  It is highly effective in preventing pregnancy.  It is reversible. You can become pregnant shortly after the IUD is removed.  It is low-maintenance and can stay in place for a long time.  There are no estrogen-related side effects.  It can be used when breastfeeding.  It is not associated with weight gain.  It can be inserted right after childbirth, an abortion, or a miscarriage. Advantages of a hormone IUD  If it is inserted within 7 days of your period starting, it works right after it is inserted. If the hormone IUD is inserted at any other time in your cycle, you will need to use a backup method of birth control for 7 days after insertion.  It can make menstrual periods lighter.  It can reduce menstrual cramping.  It can be used for 3-5 years. Advantages of a copper IUD  It works right after it is inserted.  It can be used as a form of emergency birth control if it is inserted within 5 days after having unprotected sex.  It does not interfere with your body's natural hormones.  It can be used for 10 years. What are the disadvantages of an IUD?  An IUD may cause  irregular menstrual bleeding for a period of time after insertion.  You may have pain during insertion and have cramping and vaginal bleeding after insertion.  An IUD may cut the uterus (uterine perforation) when it is inserted. This is rare.  An IUD may cause pelvic inflammatory disease (PID), which is an infection in the uterus and fallopian tubes. This is rare, and it usually happens during the first 20 days after the IUD is inserted.  A copper IUD can make your menstrual flow heavier and more painful. How is an IUD removed?  You will lie on your back with your knees bent and your feet in footrests (stirrups).  A device will be inserted into your vagina to spread apart the vaginal walls (speculum). This will allow your health care provider to see the strings attached to the IUD.  Your health care provider will use a small instrument (forceps) to grasp the IUD strings and pull firmly until the IUD is removed. You may have some discomfort when the IUD is removed. Your health care provider may recommend taking over-the-counter pain relievers, such as ibuprofen, before the procedure. You may also have minor spotting for a few days after the procedure. The exact procedure for IUD removal may vary among health care providers and hospitals. Is the IUD right for me? Your health care provider will make sure you are a good candidate for an IUD and will discuss the advantages, disadvantages, and possible side effects with you. Summary  An intrauterine  device (IUD) is a medical device that is inserted in the uterus to prevent pregnancy. It is a small, T-shaped device that has one or two nylon strings hanging down from it.  A hormone IUD contains the hormone progestin (synthetic progesterone). A copper IUD has copper wire wrapped around it.  Synthetic progesterone in a hormone IUD prevents pregnancy by thickening cervical mucus and thinning the walls of the uterus. Copper in a copper IUD prevents  pregnancy by making the uterus and fallopian tubes produce a fluid that kills sperm.  A hormone IUD can be left in place for 3-5 years. A copper IUD can be left in place for up to 10 years.  An IUD is inserted and removed by a health care provider. You may feel some pain during insertion and removal. Your health care provider may recommend taking over-the-counter pain medicine, such as ibuprofen, before an IUD procedure. This information is not intended to replace advice given to you by your health care provider. Make sure you discuss any questions you have with your health care provider. Document Revised: 03/06/2017 Document Reviewed: 04/22/2016 Elsevier Patient Education  2020 [employer].

## 2020-02-20 NOTE — Progress Notes (Signed)
   PRENATAL VISIT NOTE  Subjective:  Jamie [name] is a 24 y.o. [phone] at [redacted]w[redacted]d being seen today for ongoing prenatal care.  She is currently monitored for the following issues for this high-risk pregnancy and has Supervision of high risk pregnancy, antepartum; IUGR (intrauterine growth restriction) affecting care of mother; and [redacted] weeks gestation of pregnancy on their problem list.  Patient doing well with no acute concerns today. She reports no complaints.  Contractions: Not present. Vag. Bleeding: None.  Movement: Present. Denies leaking of fluid.   Long discussion with patient regarding birth control afterwards.  Pt initially wanted tubal ligation, but changed her mind after she realized it was a surgery.  She is currently considering IUD and less likely a nexplanon.   Chart reviewed for [facility], last u/s on Nov 1st showed ? IUGR at <5%.  No other scans or intervention noted, fundal height is 3 weeks behind.  Pt smokes 1 cigarette a day but isnt ready to quit.  The following portions of the patient's history were reviewed and updated as appropriate: allergies, current medications, past family history, past medical history, past social history, past surgical history and problem list. Problem list updated.  Objective:   Vitals:   02/20/20 0847  BP: 108/71  Pulse: 88  Weight: 149 lb 14.4 oz (68 kg)    Fetal Status: Fetal Heart Rate (bpm): 138   Movement: Present     General:  Alert, oriented and cooperative. Patient is in no acute distress.  Skin: Skin is warm and dry. No rash noted.   Cardiovascular: Normal heart rate noted  Respiratory: Normal respiratory effort, no problems with respiration noted  Abdomen: Soft, gravid, appropriate for gestational age.  Pain/Pressure: Present     Pelvic: Cervical exam deferred        Extremities: Normal range of motion.  Edema: None  Mental Status:  Normal mood and affect. Normal behavior. Normal judgment and thought content.   Assessment and  Plan:  Pregnancy: [id number] at [redacted]w[redacted]d  1. Supervision of high risk pregnancy, antepartum  - Ambulatory referral to Integrated Behavioral Health - [place] MFM OB FOLLOW UP; Future - US FETAL BPP W/NONSTRESS; Future - [place] MFM UA CORD DOPPLER; Future  2. Anxiety  - Ambulatory referral to Integrated Behavioral Health  3. Poor fetal growth affecting management of mother in third trimester, single or unspecified fetus Will try to schedule this week or early next week - [place] [facility] OB FOLLOW UP; Future - US FETAL BPP W/NONSTRESS; Future - [place] M[facility] UA CORD DOPPLER; Future  4. [redacted] weeks gestation of pregnancy   Preterm labor symptoms and general obstetric precautions including but not limited to vaginal bleeding, contractions, leaking of fluid and fetal movement were reviewed in detail with the patient.  Please refer to After Visit Summary for other counseling recommendations.   Return in about 2 weeks (around 03/05/2020) for [facility], in person, 36 weeks swabs.   Mariel Aloe, MD

## 2020-02-23 ENCOUNTER — Ambulatory Visit: Payer: Medicaid Other

## 2020-02-23 ENCOUNTER — Ambulatory Visit: Payer: Medicaid Other | Attending: Obstetrics and Gynecology

## 2020-02-23 NOTE — BH Specialist Note (Signed)
Integrated Behavioral Health via Telemedicine Video (Caregility) Visit  02/23/2020 Jamie [name] [id number]  Number of Integrated Behavioral Health visits: 1 Session Start time: 8:53  Session End time: 9:36 Total time: 43 minutes  Referring Provider: Mariel Aloe, MD Type of Service: Individual Patient/Family location: Home [facility] Provider location: [facility] for [facility]  All persons participating in visit: [name] and Jamie [name]    I connected with Jamie [name] a video enabled telemedicine application (Caregility) and verified that I am speaking with Jamie correct person using two identifiers.   Discussed confidentiality: Yes   Confirmed demographics & insurance:  Yes   I discussed that engaging in this virtual visit, they consent to Jamie provision of behavioral healthcare and Jamie services will be billed under their insurance.   Patient and/or legal guardian expressed understanding and consented to virtual visit: Yes   PRESENTING CONCERNS: Patient and/or family reports Jamie following symptoms/concerns: Pt states her primary concern today is "a little anxious" about upcoming childbirth and life stress (soon to be three children 3 and under); pt says she has experienced anxiety and depression "for years" as normal; used to smoke to cope with anxious feelings; open to learn healthy self-coping strategy today. Pt denies SI; denies intent or plan.  Duration of problem: Ongoing; Severity of problem: moderately severe  STRENGTHS (Protective Factors/Coping Skills): Social connections  ASSESSMENT: Patient currently experiencing Mood disorder, unspecified   GOALS ADDRESSED: Patient will: 1.  Reduce symptoms of: anxiety, depression and stress  2.  Increase knowledge and/or ability of: healthy habits and self-management skills  3.  Demonstrate ability to: Increase healthy adjustment to current life circumstances, Increase  adequate support systems for patient/family and Increase motivation to adhere to plan of care   Progress of Goals: Ongoing  INTERVENTIONS: Interventions utilized:  Mindfulness or Management consultant, Psychoeducation and/or Health Education and Link to [employer] Standardized Assessments completed & reviewed: GAD-7 and PHQ 9   OUTCOME: Patient Response: Pt agrees to treatment plan   PLAN: 1. Follow up with behavioral health clinician on : One month 2. Behavioral recommendations:  -See After Visit Summary for additional information to use, as needed -Continue taking prenatal vitamin as recommended  -Set up MyChart today; contact MyChart Help Desk as needed at [phone] -Go to [email or web address] to view Virtual Tour of [facility] -Consider using CALM relaxation breathing exercise as needed throughout Jamie day; consider apps as additional self-care 3. Referral(s): Integrated [employer] (In Clinic)  I discussed Jamie assessment and treatment plan with Jamie patient and/or parent/guardian. They were provided an opportunity to ask questions and all were answered. They agreed with Jamie plan and demonstrated an understanding of Jamie instructions.   They were advised to call back or seek an in-person evaluation as appropriate.  I discussed that Jamie purpose of this visit is to provide behavioral health care while limiting exposure to Jamie novel coronavirus.  Discussed there is a possibility of technology failure and discussed alternative modes of communication if that failure occurs.  Jamie [name]  Depression screen [facility] 2/9 02/20/2020  Decreased Interest 3  Down, Depressed, Hopeless 3  PHQ - 2 Score 6  Altered sleeping 3  Tired, decreased energy 3  Change in appetite 3  Feeling bad or failure about yourself  3  Trouble concentrating 0  Moving slowly or fidgety/restless 0  Suicidal thoughts 1  PHQ-9 Score 19   GAD 7 : Generalized Anxiety Score  02/20/2020  Nervous, Anxious, on Edge 3  Control/stop worrying 3  Worry too much - different things 3  Trouble relaxing 1  Restless 1  Easily annoyed or irritable 3  Afraid - awful might happen 2  Total GAD 7 Score 16

## 2020-03-05 ENCOUNTER — Encounter: Payer: Medicaid Other | Admitting: Obstetrics and Gynecology

## 2020-03-05 ENCOUNTER — Encounter: Payer: Self-pay | Admitting: Obstetrics and Gynecology

## 2020-03-05 ENCOUNTER — Telehealth: Payer: Self-pay | Admitting: Obstetrics and Gynecology

## 2020-03-05 NOTE — Telephone Encounter (Signed)
Attempted to reach patient about her missed OB appointment. Was not able to leave a voicemail message due to her mailbox being full.

## 2020-03-06 ENCOUNTER — Other Ambulatory Visit: Payer: Self-pay

## 2020-03-06 ENCOUNTER — Other Ambulatory Visit (HOSPITAL_COMMUNITY)
Admission: RE | Admit: 2020-03-06 | Discharge: 2020-03-06 | Disposition: A | Discharge status: Home / Self Care | Payer: Medicaid Other | Source: Ambulatory Visit | Attending: Obstetrics and Gynecology | Admitting: Obstetrics and Gynecology

## 2020-03-06 ENCOUNTER — Ambulatory Visit (INDEPENDENT_AMBULATORY_CARE_PROVIDER_SITE_OTHER): Payer: Medicaid Other | Admitting: Obstetrics and Gynecology

## 2020-03-06 ENCOUNTER — Ambulatory Visit (INDEPENDENT_AMBULATORY_CARE_PROVIDER_SITE_OTHER): Payer: Medicaid Other | Admitting: Clinical

## 2020-03-06 VITALS — BP 125/71 | HR 85 | Wt 150.2 lb

## 2020-03-06 DIAGNOSIS — O0990 Supervision of high risk pregnancy, unspecified, unspecified trimester: Secondary | ICD-10-CM | POA: Insufficient documentation

## 2020-03-06 DIAGNOSIS — O365930 Maternal care for other known or suspected poor fetal growth, third trimester, not applicable or unspecified: Secondary | ICD-10-CM

## 2020-03-06 DIAGNOSIS — F419 Anxiety disorder, unspecified: Secondary | ICD-10-CM | POA: Diagnosis not present

## 2020-03-06 DIAGNOSIS — Z3A36 36 weeks gestation of pregnancy: Secondary | ICD-10-CM | POA: Insufficient documentation

## 2020-03-06 DIAGNOSIS — F39 Unspecified mood [affective] disorder: Secondary | ICD-10-CM

## 2020-03-06 NOTE — Patient Instructions (Addendum)
Center for [facility] at [facility] [address] [place], [place] [address] [phone] (main office) [phone] (Tala Eber's office)  New mom online support groups: Www.conehealthybaby.com  Www.postpartum.net  Behavioral Health Resources:   What if I or someone I know is in crisis?  . If you are thinking about harming yourself or having thoughts of suicide, or if you know someone who is, seek help right away.  . Call your doctor or mental health care provider.  . Call 911 or go to a hospital emergency room to get immediate help, or ask a friend or family member to help you do these things; IF YOU ARE IN [place], YOU MAY GO TO WALK-IN URGENT CARE 24/7 at [facility] (see below)  . Call the [place] National Suicide Prevention Lifeline's toll-free, 24-hour hotline at 1-800-273-TALK (530)145-9053) or TTY: 1-800-799-4 TTY 540-160-9336) to talk to a trained counselor.  . If you are in crisis, make sure you are not left alone.   . If someone else is in crisis, make sure he or she is not left alone   24 Hour :   [facility]  [address] [place], [place] [address] [phone] or [phone] WALK-IN URGENT CARE 24/7  [facility] Mobile Crisis: [phone]  [place] National Suicide Hotline: (506)804-6868  [facility] the [employer] (Domestic Violence, Rape & Victim Assistance)  [phone]  [facility]  [address], [place]  [address]   [phone] or [phone]   [facility] [employer] [facility]: [phone] (8am-4pm) or [phone] (after hours)        [facility], [address] [place], [place]  [phone] Fax: [phone] [email or web address] *Interpreters available *Accepts all insurance and uninsured for Urgent Care needs *Accepts Medicaid and uninsured  for outpatient treatment   [facility]   Mon-Fri: 8am-5pm [address], [place], [place] [phone](KTGYB); [phone](fax) [email or web address]  *Accepts Medicare  [facility] [place], Fri: 8am-4pm [address], [place], [place] [address] [phone] (phone); [phone] (fax) ExShows.dk  *Accepts Medicare  [facility] Mon-Fri: 9am-5pm  [address] [place], [place] [phone] (phone); [phone]  MommyCollege.dk  *Accepts Medicaid  [facility]ovita Kussmaul [facility] [address] [place], [place]  [phone] https://www.grant.info/   [facility], 8:30am-12pm/1pm-2:30pm [address], [place], [place] [phone] (phone); [phone] (fax) www.fspcares.org  *Accepts Medicaid, sliding-scale*Bilingual services available  [facility] Mon-Fri, 8am-7pm [address] [place], [place]  [phone](SMOLM); [phone](fax) www.[email or web address]  *Accepts Medicaid *Bilingual services available  [facility] Mon-Fri: 8am-5pm, Saturday by appointment only [address] [place], [place], [place] [phone] (phone); [phone] (fax) www.[email or web address]   [facility] [address], [place], [place] [phone] www.[email or web address]  *Free & reduced services for uninsured and underinsured individuals *Bilingual services for Spanish-speaking clients 21 and under  [facility], [address], [place], [place] [phone](RXYVO); [phone](fax) KittenExchange.at  *Bring your own interpreter at first visit *Accepts Medicare and [facility]  [facility] Mon-Fri: 9am-5:30pm [address], [place], [place] [phone]  (phone), [phone] (fax) After hours crisis line: [phone] www.[email or web address]  *Accepts Medicare and Medicaid  [employer], 8am-6pm [address], [place], [place]  [phone] (phone); [phone] (fax) http://presbyteriancounseling.org  *Subsidized costs available  [facility] Mon-Fri: 8am-4pm [address] [place], [place] [phone](FGBMS); [phone](fax) www.psychotherapeuticservices.com  *  Accepts Medicaid  [facility] Same day access hours: Mon-Fri, 8:30-3pm Crisis hours: Mon-Fri, 8am-5pm [address], [place], [place] [phone]  [facility] [employer] Same day access hours: Mon-Fri, 8:30-3pm Crisis hours: Mon-Fri, 8am-8pm [address], [place], [place] [phone] (phone); [phone] (fax) www.rhahealthservices.org  *Accepts Medicaid and Medicare  [facility] [place], [place], Fri: 9am-9pm Tues, Thurs: 9am-6pm [address] [place] [address] [place], [place], [place]  [phone] (phone); [phone] (fax) https://[email or web address]  *(Accepts Medicare and Medicaid; payment plans available)*Bilingual services available  [facility] [place] [facility] [address] [place], [place] [phone] (phone); [phone] (fax) www.[email or web address]   [facility] [address], [place], [place]  [phone]  RackRewards.fr  *Bilingual services available  [facility]) Mon-Thurs: 8am-8pm [address], [place], [place] [phone] (phone); [phone] (fax) ScrapbookLive.si  *Accepts Medicaid*Bilingual services available  [facility] [address] [place], [place] [phone] (phone) BrotherBig.at  *Accepts Medicaid *Bilingual services available  [facility] Mon-Fri, 9am-4:45pm [address] [place],  [place] [phone] (phone); [phone] (fax) http://[email or web address]-[email or web address]  *Accepts Medicare  [facility] Mon-Thurs: 8:30-8pm, Fri: 8:30am-7pm [address], [place], [place] (3rd floor) [phone] (phone); [phone] (fax) https://www.[email or web address].[email or web address]  *Accepts Medicaid; income-based reduced rates available  [facility] Mon-Fri: 8am-5pm [address], [place], [place] [address] [phone] (phone); [phone] (fax) http://www.[email or web address]  *Accepts Medicaid*Bilingual services available   [facility] [place])  [address], [place] [phone] www.mhag.org  *Provides direct services to individuals in recovery from mental illness, including support groups, recovery skills classes, and one on one peer support  [employer] on Mental Illness) [place] helpline: 613-525-4563  [employer] [place] helpline: 8204670854 https://namiguilford.org  *A community hub for information relating to local resources and services for the friends and families of individuals living alongside a mental health condition, as well as the individuals themselves. Classes and support groups also provided      /[facility]otional [facility] and Websites Here are a few free apps meant to help you to help yourself.  To find, try searching on the internet to see if the app is offered on Apple/Android devices. If your first choice doesn't come up on your device, the good news is that there are many choices! Play around with different apps to see which ones are helpful to you.    Calm This is an app meant to help increase calm feelings. Includes info, strategies, and tools for tracking your feelings.      Calm Harm  This app is meant to help with self-harm. Provides many 5-minute or 15-min coping strategies for doing instead of hurting yourself.       Healthy Minds Health Minds is a problem-solving tool to help  deal with emotions and cope with stress you encounter wherever you are.      MindShift This app can help people cope with anxiety. Rather than trying to avoid anxiety, you can make an important shift and face it.      MY3  MY3 features a support system, safety plan and resources with the goal of offering a tool to use in a time of need.       My Life My Voice  This mood journal offers a simple solution for tracking your thoughts, feelings and moods. Animated emoticons can help identify your mood.       Relax Melodies Designed to help with sleep, on this app you can mix sounds and meditations for relaxation.  Smiling Mind Smiling Mind is meditation made easy: it's a simple tool that helps put a smile on your mind.        Stop, Breathe & Think  A friendly, simple guide for people through meditations for mindfulness and compassion.  Stop, Breathe and Think Kids Enter your current feelings and choose a "mission" to help you cope. Offers videos for certain moods instead of just sound recordings.       Team Orange The goal of this tool is to help teens change how they think, act, and react. This app helps you focus on your own good feelings and experiences.      The Ashland Box The Ashland Box (VHB) contains simple tools to help patients with coping, relaxation, distraction, and positive thinking.

## 2020-03-06 NOTE — Progress Notes (Signed)
   PRENATAL VISIT NOTE  Subjective:  Jamie [name] is a 24 y.o. [phone] at [redacted]w[redacted]d being seen today for ongoing prenatal care.  She is currently monitored for the following issues for this high-risk pregnancy and has Supervision of high risk pregnancy, antepartum; IUGR (intrauterine growth restriction) affecting care of mother; [redacted] weeks gestation of pregnancy; and [redacted] weeks gestation of pregnancy on their problem list.  Patient doing well with no acute concerns today. She reports no complaints.  Contractions: Not present. Vag. Bleeding: None.  Movement: Present. Denies leaking of fluid.   The following portions of the patient's history were reviewed and updated as appropriate: allergies, current medications, past family history, past medical history, past social history, past surgical history and problem list. Problem list updated.  Objective:   Vitals:   03/06/20 1113  BP: 125/71  Pulse: 85  Weight: 150 lb 3.2 oz (68.1 kg)    Fetal Status: Fetal Heart Rate (bpm): 142 Fundal Height: 35 cm Movement: Present     General:  Alert, oriented and cooperative. Patient is in no acute distress.  Skin: Skin is warm and dry. No rash noted.   Cardiovascular: Normal heart rate noted  Respiratory: Normal respiratory effort, no problems with respiration noted  Abdomen: Soft, gravid, appropriate for gestational age.  Pain/Pressure: Present     Pelvic: Cervical exam deferred        Extremities: Normal range of motion.  Edema: None  Mental Status:  Normal mood and affect. Normal behavior. Normal judgment and thought content.   Assessment and Plan:  Pregnancy: [id number] at [redacted]w[redacted]d  1. Supervision of high risk pregnancy, antepartum Pt doing well, she declined cervical exam today - Culture, beta strep (group b only) - GC/Chlamydia probe amp ([place])not at [facility]  2. Poor fetal growth affecting management of mother in third trimester, single or unspecified fetus Pt did not get fetal growth or dopplers  done, she will be rescheduled ASAP  3. [redacted] weeks gestation of pregnancy   Term labor symptoms and general obstetric precautions including but not limited to vaginal bleeding, contractions, leaking of fluid and fetal movement were reviewed in detail with the patient.  Please refer to After Visit Summary for other counseling recommendations.   Return in about 1 week (around 03/13/2020) for [facility], in person.   Mariel Aloe, MD

## 2020-03-06 NOTE — Patient Instructions (Signed)

## 2020-03-07 LAB — GC/CHLAMYDIA PROBE AMP (~~LOC~~) NOT AT ARMC
Chlamydia: NEGATIVE
Comment: NEGATIVE
Comment: NORMAL
Neisseria Gonorrhea: NEGATIVE

## 2020-03-10 LAB — CULTURE, BETA STREP (GROUP B ONLY): Strep Gp B Culture: NEGATIVE

## 2020-03-13 ENCOUNTER — Encounter: Payer: Self-pay | Admitting: *Deleted

## 2020-03-13 ENCOUNTER — Other Ambulatory Visit: Payer: Self-pay | Admitting: Obstetrics and Gynecology

## 2020-03-13 ENCOUNTER — Ambulatory Visit: Payer: Medicaid Other | Admitting: *Deleted

## 2020-03-13 ENCOUNTER — Other Ambulatory Visit: Payer: Self-pay

## 2020-03-13 ENCOUNTER — Ambulatory Visit: Payer: Medicaid Other | Attending: Obstetrics and Gynecology

## 2020-03-13 DIAGNOSIS — O0990 Supervision of high risk pregnancy, unspecified, unspecified trimester: Secondary | ICD-10-CM | POA: Insufficient documentation

## 2020-03-13 DIAGNOSIS — O365930 Maternal care for other known or suspected poor fetal growth, third trimester, not applicable or unspecified: Secondary | ICD-10-CM

## 2020-03-16 ENCOUNTER — Encounter: Payer: Medicaid Other | Admitting: Family Medicine

## 2020-03-19 ENCOUNTER — Inpatient Hospital Stay (HOSPITAL_COMMUNITY)
Admission: AD | Admit: 2020-03-19 | Discharge: 2020-03-20 | DRG: 806 | Disposition: A | Discharge status: Home / Self Care | Payer: Medicaid Other | Attending: Family Medicine | Admitting: Family Medicine

## 2020-03-19 ENCOUNTER — Other Ambulatory Visit: Payer: Self-pay

## 2020-03-19 ENCOUNTER — Encounter (HOSPITAL_COMMUNITY): Payer: Self-pay | Admitting: Obstetrics and Gynecology

## 2020-03-19 DIAGNOSIS — F419 Anxiety disorder, unspecified: Secondary | ICD-10-CM

## 2020-03-19 DIAGNOSIS — Z20822 Contact with and (suspected) exposure to covid-19: Secondary | ICD-10-CM | POA: Diagnosis present

## 2020-03-19 DIAGNOSIS — O365930 Maternal care for other known or suspected poor fetal growth, third trimester, not applicable or unspecified: Secondary | ICD-10-CM | POA: Diagnosis present

## 2020-03-19 DIAGNOSIS — O99324 Drug use complicating childbirth: Secondary | ICD-10-CM | POA: Diagnosis present

## 2020-03-19 DIAGNOSIS — O99325 Drug use complicating the puerperium: Secondary | ICD-10-CM | POA: Diagnosis not present

## 2020-03-19 DIAGNOSIS — Z3A38 38 weeks gestation of pregnancy: Secondary | ICD-10-CM

## 2020-03-19 DIAGNOSIS — O0990 Supervision of high risk pregnancy, unspecified, unspecified trimester: Secondary | ICD-10-CM

## 2020-03-19 DIAGNOSIS — O99334 Smoking (tobacco) complicating childbirth: Secondary | ICD-10-CM | POA: Diagnosis present

## 2020-03-19 DIAGNOSIS — R87610 Atypical squamous cells of undetermined significance on cytologic smear of cervix (ASC-US): Secondary | ICD-10-CM | POA: Diagnosis not present

## 2020-03-19 DIAGNOSIS — O99345 Other mental disorders complicating the puerperium: Secondary | ICD-10-CM | POA: Diagnosis not present

## 2020-03-19 DIAGNOSIS — Z3490 Encounter for supervision of normal pregnancy, unspecified, unspecified trimester: Secondary | ICD-10-CM

## 2020-03-19 DIAGNOSIS — Z8759 Personal history of other complications of pregnancy, childbirth and the puerperium: Secondary | ICD-10-CM | POA: Diagnosis not present

## 2020-03-19 DIAGNOSIS — F17210 Nicotine dependence, cigarettes, uncomplicated: Secondary | ICD-10-CM | POA: Diagnosis present

## 2020-03-19 DIAGNOSIS — F1290 Cannabis use, unspecified, uncomplicated: Secondary | ICD-10-CM | POA: Diagnosis present

## 2020-03-19 DIAGNOSIS — O4202 Full-term premature rupture of membranes, onset of labor within 24 hours of rupture: Secondary | ICD-10-CM

## 2020-03-19 DIAGNOSIS — Z720 Tobacco use: Secondary | ICD-10-CM | POA: Diagnosis not present

## 2020-03-19 DIAGNOSIS — O26893 Other specified pregnancy related conditions, third trimester: Secondary | ICD-10-CM | POA: Diagnosis present

## 2020-03-19 DIAGNOSIS — O365990 Maternal care for other known or suspected poor fetal growth, unspecified trimester, not applicable or unspecified: Secondary | ICD-10-CM | POA: Diagnosis present

## 2020-03-19 HISTORY — DX: Cannabis use, unspecified, uncomplicated: F12.90

## 2020-03-19 HISTORY — DX: Anxiety disorder, unspecified: F41.9

## 2020-03-19 LAB — CBC
HCT: 37.4 % (ref 36.0–46.0)
Hemoglobin: 12.3 g/dL (ref 12.0–15.0)
MCH: 30.5 pg (ref 26.0–34.0)
MCHC: 32.9 g/dL (ref 30.0–36.0)
MCV: 92.8 fL (ref 80.0–100.0)
Platelets: 243 10*3/uL (ref 150–400)
RBC: 4.03 MIL/uL (ref 3.87–5.11)
RDW: 13.4 % (ref 11.5–15.5)
WBC: 9.5 10*3/uL (ref 4.0–10.5)
nRBC: 0 % (ref 0.0–0.2)

## 2020-03-19 LAB — TYPE AND SCREEN
ABO/RH(D): A POS
Antibody Screen: NEGATIVE

## 2020-03-19 LAB — RPR: RPR Ser Ql: NONREACTIVE

## 2020-03-19 LAB — RESP PANEL BY RT-PCR (FLU A&B, COVID) ARPGX2
Influenza A by PCR: NEGATIVE
Influenza B by PCR: NEGATIVE
SARS Coronavirus 2 by RT PCR: NEGATIVE

## 2020-03-19 LAB — POCT FERN TEST: POCT Fern Test: POSITIVE

## 2020-03-19 MED ORDER — SENNOSIDES-DOCUSATE SODIUM 8.6-50 MG PO TABS
2.0000 | ORAL_TABLET | ORAL | Status: DC
  Administered 2020-03-19: 2 via ORAL
  Filled 2020-03-19: qty 2

## 2020-03-19 MED ORDER — LACTATED RINGERS IV SOLN: INTRAVENOUS | Status: DC

## 2020-03-19 MED ORDER — WITCH HAZEL-GLYCERIN EX PADS: 1.0000 "application " | MEDICATED_PAD | CUTANEOUS | Status: DC | PRN

## 2020-03-19 MED ORDER — LIDOCAINE HCL (PF) 1 % IJ SOLN
30.0000 mL | INTRAMUSCULAR | Status: DC | PRN
  Filled 2020-03-19: qty 30

## 2020-03-19 MED ORDER — COCONUT OIL OIL: 1.0000 "application " | TOPICAL_OIL | Status: DC | PRN

## 2020-03-19 MED ORDER — ONDANSETRON HCL 4 MG/2ML IJ SOLN: 4.0000 mg | INTRAMUSCULAR | Status: DC | PRN

## 2020-03-19 MED ORDER — LACTATED RINGERS IV SOLN
500.0000 mL | INTRAVENOUS | Status: DC | PRN
Start: 2020-03-19 — End: 2020-03-19

## 2020-03-19 MED ORDER — FENTANYL CITRATE (PF) 100 MCG/2ML IJ SOLN
50.0000 ug | INTRAMUSCULAR | Status: DC | PRN
  Administered 2020-03-19: 50 ug via INTRAVENOUS
  Administered 2020-03-19: 100 ug via INTRAVENOUS
  Administered 2020-03-19: 50 ug via INTRAVENOUS
  Filled 2020-03-19 (×3): qty 2

## 2020-03-19 MED ORDER — OXYTOCIN-SODIUM CHLORIDE 30-0.9 UT/500ML-% IV SOLN
2.5000 [IU]/h | INTRAVENOUS | Status: DC
  Filled 2020-03-19: qty 500

## 2020-03-19 MED ORDER — ACETAMINOPHEN 325 MG PO TABS
650.0000 mg | ORAL_TABLET | Freq: Four times a day (QID) | ORAL | Status: DC
  Administered 2020-03-19 – 2020-03-20 (×3): 650 mg via ORAL
  Filled 2020-03-19 (×3): qty 2

## 2020-03-19 MED ORDER — OXYCODONE-ACETAMINOPHEN 5-325 MG PO TABS
1.0000 | ORAL_TABLET | ORAL | Status: DC | PRN
Start: 2020-03-19 — End: 2020-03-19

## 2020-03-19 MED ORDER — OXYCODONE-ACETAMINOPHEN 5-325 MG PO TABS: 2.0000 | ORAL_TABLET | ORAL | Status: DC | PRN

## 2020-03-19 MED ORDER — ONDANSETRON HCL 4 MG PO TABS: 4.0000 mg | ORAL_TABLET | ORAL | Status: DC | PRN

## 2020-03-19 MED ORDER — ACETAMINOPHEN 325 MG PO TABS
650.0000 mg | ORAL_TABLET | ORAL | Status: DC | PRN
  Administered 2020-03-19: 650 mg via ORAL
  Filled 2020-03-19: qty 2

## 2020-03-19 MED ORDER — SIMETHICONE 80 MG PO CHEW: 80.0000 mg | CHEWABLE_TABLET | ORAL | Status: DC | PRN

## 2020-03-19 MED ORDER — ACETAMINOPHEN 325 MG PO TABS: 650.0000 mg | ORAL_TABLET | ORAL | Status: DC | PRN

## 2020-03-19 MED ORDER — DIBUCAINE (PERIANAL) 1 % EX OINT: 1.0000 "application " | TOPICAL_OINTMENT | CUTANEOUS | Status: DC | PRN

## 2020-03-19 MED ORDER — BENZOCAINE-MENTHOL 20-0.5 % EX AERO: 1.0000 "application " | INHALATION_SPRAY | CUTANEOUS | Status: DC | PRN

## 2020-03-19 MED ORDER — ONDANSETRON HCL 4 MG/2ML IJ SOLN: 4.0000 mg | Freq: Four times a day (QID) | INTRAMUSCULAR | Status: DC | PRN

## 2020-03-19 MED ORDER — SOD CITRATE-CITRIC ACID 500-334 MG/5ML PO SOLN: 30.0000 mL | ORAL | Status: DC | PRN

## 2020-03-19 MED ORDER — TETANUS-DIPHTH-ACELL PERTUSSIS 5-2.5-18.5 LF-MCG/0.5 IM SUSY: 0.5000 mL | PREFILLED_SYRINGE | Freq: Once | INTRAMUSCULAR | Status: DC

## 2020-03-19 MED ORDER — IBUPROFEN 600 MG PO TABS
600.0000 mg | ORAL_TABLET | Freq: Four times a day (QID) | ORAL | Status: DC
  Administered 2020-03-19 – 2020-03-20 (×4): 600 mg via ORAL
  Filled 2020-03-19 (×5): qty 1

## 2020-03-19 MED ORDER — DIPHENHYDRAMINE HCL 25 MG PO CAPS: 25.0000 mg | ORAL_CAPSULE | Freq: Four times a day (QID) | ORAL | Status: DC | PRN

## 2020-03-19 MED ORDER — OXYTOCIN BOLUS FROM INFUSION
333.0000 mL | Freq: Once | INTRAVENOUS | Status: AC
  Administered 2020-03-19: 10:00:00 333 mL via INTRAVENOUS

## 2020-03-19 NOTE — Progress Notes (Signed)
Labor Progress Note Merrilee Ancona is a 24 y.o. [id number] at [redacted]w[redacted]d presented for SOL/SROM@0230 . S: Sleeping.   O:  BP 115/66   Pulse 74   Temp 98.2 F (36.8 C) (Oral)   Resp 17   Ht 5\' 9"  (1.753 m)   Wt 68 kg   LMP 06/05/2019   BMI 22.15 kg/m  EFM: baseline 130bpm/mod variability/+accels/no decels Toco: q1-3 min  CVE: Dilation: 6.5 Effacement (%): 80 Station: -1 Presentation: Vertex Exam by:: [email or web address], RN   A&P: 24 y.o. 21 [redacted]w[redacted]d presented for SOL/SROM@0230 . #Labor: Progressing well. Continue expectant management. #Pain: PRN #FWB: cat 1 #GBS negative  #THC use/tobacco use: SW postpartum   [redacted]w[redacted]d, MD 7:59 AM

## 2020-03-19 NOTE — Discharge Instructions (Signed)

## 2020-03-19 NOTE — ED Triage Notes (Signed)
Emergency Medicine Provider OB Triage Evaluation Note  Jamie [name] is a 24 y.o. female, [id number], at [redacted]w[redacted]d gestation who presents to the emergency department with complaints of "Water broke".  Review of  Systems  Positive: gush of fluid Negative: severe abdominal pain  Physical Exam  LMP 06/05/2019  General: Awake, no distress  HEENT: Atraumatic  Resp: Normal effort  Abd: gravid MSK: Moves all extremities without difficulty Neuro: Speech clear  Medical Decision Making  Pt evaluated for pregnancy concern and is stable for transfer to MAU. Pt is in agreement with plan for transfer.  2:38 AM Discussed with MAU APP, Jamie [name], who accepts patient in transfer.  Clinical Impression   1. Pregnancy, unspecified gestational age       Jamie [name], Jamie [name] 03/19/20 [phone]

## 2020-03-19 NOTE — ED Triage Notes (Signed)
Pt arrives to this ED, ambulatory, NAD at present. States water broke, 20 min PTA. Pt approx 38 weeks preg. Medically screened by Dr. Bebe Shaggy. Ok to send to MAU.

## 2020-03-19 NOTE — H&P (Signed)
OBSTETRIC ADMISSION HISTORY AND PHYSICAL  Jamie [name] is a 24 y.o. female [phone] with IUP at [redacted]w[redacted]d by early u/s presenting for SROM @0230 . She also endorses contractions. She reports +FMs, No LOF, no VB, no blurry vision, headaches or peripheral edema, and RUQ pain.  She plans on bottle feeding. She request Liletta at postpartum visit for birth control. She received her prenatal care at [facility]   Dating: By early u/s --->  Estimated Date of Delivery: 03/28/20  Sono:    03/13/20@[redacted]w[redacted]d , CWD, normal anatomy, cephalic presentation, 3070g, 14/7/21 EFW   Prenatal History/Complications:  THC use in pregnancy IUGR (resolved on f/u) Abnormal pap smear Anxiety (no meds) Tobacco abuse  Past Medical History: Past Medical History:  Diagnosis Date  . Breast lump     Past Surgical History: No past surgical history on file.  Obstetrical History: OB History    Gravida  4   Para  2   Term  2   Preterm  0   AB  1   Living  2     SAB  1   IAB  0   Ectopic  0   Multiple  0   Live Births  2           Social History Social History   Socioeconomic History  . Marital status: Single    Spouse name: Not on file  . Number of children: Not on file  . Years of education: Not on file  . Highest education level: Not on file  Occupational History  . Not on file  Tobacco Use  . Smoking status: Current Every Day Smoker    Packs/day: 0.15    Types: Cigarettes  . Smokeless tobacco: Never Used  Vaping Use  . Vaping Use: Never used  Substance and Sexual Activity  . Alcohol use: Never  . Drug use: Never  . Sexual activity: Not on file  Other Topics Concern  . Not on file  Social History Narrative  . Not on file   Social Determinants of Health   Financial Resource Strain: Not on file  Food Insecurity: Not on file  Transportation Needs: Not on file  Physical Activity: Not on file  Stress: Not on file  Social Connections: Not on file    Family History: Family  History  Problem Relation Age of Onset  . Hypertension Mother   . Hypertension Maternal Grandmother     Allergies: No Known Allergies  Medications Prior to Admission  Medication Sig Dispense Refill Last Dose  . acetaminophen (TYLENOL) 325 MG tablet Take 650 mg by mouth every 6 (six) hours as needed for mild pain, moderate pain or headache. (Patient not taking: Reported on 02/20/2020)     . doxycycline (VIBRAMYCIN) 100 MG capsule Take 1 capsule (100 mg total) by mouth 2 (two) times daily. (Patient not taking: Reported on 02/20/2020) 28 capsule 0   . metroNIDAZOLE (FLAGYL) 500 MG tablet Take 1 tablet (500 mg total) by mouth 2 (two) times daily. (Patient not taking: Reported on 02/20/2020) 14 tablet 0   . Prenatal Vit-Fe Fumarate-FA (M-NATAL PLUS) 27-1 MG TABS Take by mouth.         Review of Systems   All systems reviewed and negative except as stated in HPI  Blood pressure 118/64, pulse 86, temperature 98.2 F (36.8 C), temperature source Oral, resp. rate 16, last menstrual period 06/05/2019. General appearance: alert, cooperative and no distress Lungs: normal respiratory effort Heart: regular rate and rhythm Abdomen: soft,  non-tender; gravid Pelvic: as noted below Extremities: Homans sign is negative, no sign of DVT Presentation: cephalic by BSUS in MAU Fetal monitoringBaseline: 135 bpm, Variability: Good {> 6 bpm), Accelerations: Reactive and Decelerations: Absent Uterine activityFrequency: Every 1-4 minutes     Prenatal labs: ABO, Rh: A/Positive/-- (07/29 0000) Antibody: Negative (07/29 0000) Rubella: Immune (07/29 0000) RPR: Nonreactive (10/01 0000)  HBsAg: Negative (10/01 0000)  HIV: Non-reactive (10/01 0000)  GBS: Negative/-- (11/30 1159)  1 hr Glucola passed Genetic screening quad neg Anatomy US FGR, resolved on f/u  Prenatal Transfer Tool  Maternal Diabetes: No Genetic Screening: Normal Maternal Ultrasounds/Referrals: IUGR, f/u normal Fetal Ultrasounds or  other Referrals:  Referred to Materal Fetal Medicine  Maternal Substance Abuse:  Yes:  Type: Smoker, Marijuana Significant Maternal Medications:  None Significant Maternal Lab Results: Group B Strep negative  Results for orders placed or performed during the hospital encounter of 03/19/20 (from the past 24 hour(s))  POCT fern test   Collection Time: 03/19/20  3:14 AM  Result Value Ref Range   POCT Fern Test Positive = ruptured amniotic membanes     Patient Active Problem List   Diagnosis Date Noted  . Supervision of high risk pregnancy, antepartum 02/14/2020  . IUGR (intrauterine growth restriction) affecting care of mother 02/14/2020    Assessment/Plan:  Tisheena Maguire is a 24 y.o. [id number] at [redacted]w[redacted]d here for SROM@0230 /SOL.  #Labor: Reported SROM@0230  and reports frequent contractions. Making cervical change, continue expectant management. #Pain: declines #FWB: Cat 1 #ID: GBS neg #MOF: bottle #MOC: liletta at postpartum visit #Circ: yes #THC/tobacco use: postpartum SW consult  Alric Seton, MD  03/19/2020, 3:21 AM

## 2020-03-19 NOTE — MAU Note (Signed)
.   Jamie [name] is a 24 y.o. at [redacted]w[redacted]d here in MAU reporting: ROM at 0230. Denies any pain  Onset of complaint:0230 Pain score: 0 Vitals:   03/19/20 0307  BP: 118/64  Pulse: 86  Resp: 16  Temp: 98.2 F (36.8 C)     FHT:135 Lab orders placed from triage: none

## 2020-03-19 NOTE — Discharge Summary (Signed)
Postpartum Discharge Summary  Date of Servic     Patient Name: Jamie [name] DOB: [birth year] MRN: [id number]  Date of admission: 03/19/2020 Delivery date:03/19/2020  Delivering provider: PRAY, Joycelyn Schmid E  Date of discharge: 03/20/2020  Admitting diagnosis: Pregnancy, unspecified gestational age [Z68.90] Normal labor [O80, Z37.9] Intrauterine pregnancy: [redacted]w[redacted]d    Secondary diagnosis:  Principal Problem:   Vaginal delivery Active Problems:   Supervision of high risk pregnancy, antepartum   IUGR (intrauterine growth restriction) affecting care of mother   Atypical squamous cells of undetermined significance (ASCUS) on Papanicolaou smear of cervix   Marijuana use   Tobacco abuse   Anxiety  Additional problems: IUGR- resolved on most recent growth sono with EFW 37%ile    Discharge diagnosis: Term Pregnancy Delivered                                              Post partum procedures:none Augmentation: None Complications: None  Hospital course: Onset of Labor With Vaginal Delivery      24y.o. yo [id number] 344w5das admitted in Latent Labor on 03/19/2020. Patient had an uncomplicated labor course as follows:  Membrane Rupture Time/Date: 2:30 AM ,03/19/2020   Delivery Method:Vaginal, Spontaneous  Episiotomy: None  Lacerations:  1st degree  Patient had an uncomplicated postpartum course.  She is ambulating, tolerating a regular diet, passing flatus, and urinating well. Patient is discharged home in stable condition on 03/20/20.  Newborn Data: Birth date:03/19/2020  Birth time:10:01 AM  Gender:Female  Living status:Living  Apgars:8 ,9  Weight:3036 g   Magnesium Sulfate received: No BMZ received: No Rhophylac:N/A MMR:No T-DaP:Given prenatally Flu: N/A - pt declined Transfusion:No  Physical exam  Vitals:   03/19/20 1740 03/19/20 2000 03/19/20 2344 03/20/20 0508  BP: 109/73 109/62 102/64 107/75  Pulse: 73 (!) 57 66 68  Resp: _0 Temp: 98.1 F (36.7 C)  98 F (36.7 C) 98.5 F (36.9 C) 97.9 F (36.6 C)  TempSrc: Oral Oral Oral Oral  SpO2: 100% 100% 100% 100%  Weight:      Height:       General: alert, cooperative and no distress Lochia: appropriate Uterine Fundus: firm Incision: N/A DVT Evaluation: No evidence of DVT seen on physical exam. Negative Homan's sign. No cords or calf tenderness. Labs: Lab Results  Component Value Date   WBC 9.5 03/19/2020   HGB 12.3 03/19/2020   HCT 37.4 03/19/2020   MCV 92.8 03/19/2020   PLT 243 03/19/2020   CMP Latest Ref Rng & Units 07/31/2019  Glucose 70 - 99 mg/dL 96  BUN 6 - 20 mg/dL 8  Creatinine 0.44 - 1.00 mg/dL 0.71  Sodium 135 - 145 mmol/L 137  Potassium 3.5 - 5.1 mmol/L 3.5  Chloride 98 - 111 mmol/L 107  CO2 22 - 32 mmol/L 23  Calcium 8.9 - 10.3 mg/dL 9.0  Total Protein 6.5 - 8.1 g/dL 6.5  Total Bilirubin 0.3 - 1.2 mg/dL 0.6  Alkaline Phos 38 - 126 U/L 48  AST 15 - 41 U/L 18  ALT 0 - 44 U/L 14   Edinburgh Score: No flowsheet data found.   After visit meds:  Allergies as of 03/20/2020   No Known Allergies     Medication List    STOP taking these medications   acetaminophen 325 MG tablet Commonly known as: TYLENOL  doxycycline 100 MG capsule Commonly known as: VIBRAMYCIN   metroNIDAZOLE 500 MG tablet Commonly known as: FLAGYL     TAKE these medications   ibuprofen 600 MG tablet Commonly known as: ADVIL Take 1 tablet (600 mg total) by mouth every 6 (six) hours.   M-Natal Plus 27-1 MG Tabs Take by mouth.        Discharge home in stable condition Infant Feeding: Bottle Infant Disposition:home with mother Discharge instruction: per After Visit Summary and Postpartum booklet. Activity: Advance as tolerated. Pelvic rest for 6 weeks.  Diet: routine diet Future Appointments: Future Appointments  Date Time Provider [place]  04/17/2020  9:15 AM Darlina Rumpf, CNM [facility]   Follow up Visit:  Follow-up Information    Clarnce Flock, MD Follow up on 04/17/2020.   Specialty: Family Medicine Why: for postpartum checkup Contact information: [place] [address] [phone]              message sent to schedulers on 03/19/20   Please schedule this patient for a In person postpartum visit in 4 weeks with the following provider: Any provider. Additional Postpartum F/U: None Low risk pregnancy complicated by: None Delivery mode:  Vaginal, Spontaneous  Anticipated Birth Control:  IUD outpatient   03/20/2020 Christin Fudge, CNM

## 2020-03-20 DIAGNOSIS — O99345 Other mental disorders complicating the puerperium: Secondary | ICD-10-CM

## 2020-03-20 DIAGNOSIS — Z8759 Personal history of other complications of pregnancy, childbirth and the puerperium: Secondary | ICD-10-CM

## 2020-03-20 DIAGNOSIS — F1290 Cannabis use, unspecified, uncomplicated: Secondary | ICD-10-CM

## 2020-03-20 DIAGNOSIS — R87610 Atypical squamous cells of undetermined significance on cytologic smear of cervix (ASC-US): Secondary | ICD-10-CM

## 2020-03-20 DIAGNOSIS — F419 Anxiety disorder, unspecified: Secondary | ICD-10-CM

## 2020-03-20 DIAGNOSIS — Z720 Tobacco use: Secondary | ICD-10-CM

## 2020-03-20 DIAGNOSIS — O99325 Drug use complicating the puerperium: Secondary | ICD-10-CM

## 2020-03-20 MED ORDER — IBUPROFEN 600 MG PO TABS: 600.0000 mg | ORAL_TABLET | Freq: Four times a day (QID) | ORAL | 0 refills | Status: DC

## 2020-03-20 NOTE — Social Work (Signed)
CSW met with MOB to assist with transportation home, as well as to baby's follow-up appointment. CSW contacted [facility] with MOB present and had a ride scheduled for Thursday at 1:00pm (appointment time of 1:45p). CSW spoke with [facility] and was informed MOB could utilize the service by calling, for future appointments. CSW provided MOB with phone number to schedule transportation for future appointments if needed. CSW also provided MOB with phone number for Medicaid transports.  CSW scheduled [facility] for MOB and newborn's discharge home.   MOB addressed MOB [place] score greater than 10. CSW reviewed perinatal mental health resources with MOB, to access if needed postpartum.   CSW will continue to follow CDS and make an additional report if warranted. CSW identifies no further need for intervention and no barriers to discharge at this time.  Jamie [name], MSW, LCSWA Clinical Social Work [facility] [phone] 

## 2020-03-20 NOTE — Progress Notes (Addendum)
Post Partum Day 1 Subjective: Jamie [name] is a 24 yo [phone] s/p VD. She reports no adverse events overnight. Pain is well tolerated. Lochia is decreasing. Patient is bottle feeding with no issues. No issues voiding, + BM. Patient states she hasn't eaten much since she has been here and her appetite is slowly returning. Yesterday she ate a sandwich, no N/V. Patient has decided upon the [place] IUD for birth control and it will be placed outpatient. Patient has been ambulating around the floor.   Objective: Blood pressure 107/75, pulse 68, temperature 97.9 F (36.6 C), temperature source Oral, resp. rate 18, height 5' 9"  (1.753 m), weight 68 kg, last menstrual period 06/05/2019, SpO2 100 %, unknown if currently breastfeeding.  Physical Exam:  General: alert, appears stated age and no distress Lochia: appropriate Uterine Fundus: firm DVT Evaluation: No evidence of DVT seen on physical exam. - Negative Homan's sign. - No cords or calf tenderness. - No significant calf/ankle edema.  Recent Labs    03/19/20 0325  HGB 12.3  HCT 37.4    Assessment/Plan:  1. S/p vaginal delivery 2. IUGR affecting care of mother 3. ASCUS on Pap Smear 4. Marijuana use 5. Tobacco Use 5. Anxiety  - Discharge home, medically stable - Social Work consult prior to discharge - Circumcision prior to discharge, patient consented - Contraception decided upon is [place] IUD which will be placed outpatient.   LOS: 1 day   Laurena Spies PA-S 03/20/2020, 7:22 AM    I personally saw and evaluated the patient, performing the key elements of the service. I developed and verified the management plan that is described in the resident's/student's note, and I agree with the content with my edits above. VSS, HRR&R, Resp unlabored, Legs neg.  Nigel Berthold, CNM 03/22/2020 4:40 PM

## 2020-03-20 NOTE — Clinical Social Work Maternal (Signed)
CLINICAL SOCIAL WORK MATERNAL/CHILD NOTE  Patient Details  Name: Jamie [name] MRN: [id number] Date of Birth: 12/17/1995  Date:  03/20/2020  Clinical Social Worker Initiating Note:  Aloni Chuang, MSW, LCSWA Date/Time: Initiated:  03/20/20/0900     Child's Name:  [name]   Biological Parents:  Mother   Need for Interpreter:  None   Reason for Referral:  Current Substance Use/Substance Use During Pregnancy    Address:  [address] [place] [address]    Phone number:  [phone] (home)     Additional phone number:   Household Members/Support Persons (HM/SP):   Household Member/Support Person 1,Household Member/Support Person 2   HM/SP Name Relationship DOB or Age  HM/SP -1 Kelliyah Merry-Waddler Daughter 10/18/2006  HM/SP -2 Royalty Randleman Daughter 12/07/2017  HM/SP -3        HM/SP -4        HM/SP -5        HM/SP -6        HM/SP -7        HM/SP -8          Natural Supports (not living in the home):  Immediate Family   Professional Supports: None   Employment: Unemployed   Type of Work: Unemployed   Education:  9 to 11 years   Homebound arranged:    Financial Resources:  Medicaid   Other Resources:  Food Stamps ,WIC   Cultural/Religious Considerations Which May Impact Care:    Strengths:  Ability to meet basic needs ,Home prepared for child    Psychotropic Medications:         Pediatrician:       Pediatrician List:   [place]    [place]    [place]    [place]    [place]    [place]      Pediatrician Fax Number:    Risk Factors/Current Problems:  Transportation ,Substance Use    Cognitive State:  Alert ,Linear Thinking    Mood/Affect:  Calm ,Happy ,Interested    CSW Assessment: CSW consulted for transportation and THC use. CSW met with MOB to complete assessment and offer support. CSW entered room and observed MOB holding newborn. CSW introduced self and role. MOB was  pleasant and engaged throughout assessment. CSW informed MOB of reason for consult. MOB reported she smoked marijuana to assist with appetite during pregnancy. MOB stated she last used two months ago. MOB denied using any other substances during pregnancy. CSW informed MOB of the hospital drug screen policy. MOB was informed an UDS/CDS is performed on baby and a CPS report is made if substances are found to be present. MOB was informed baby's UDS is positive for THC and a CPS report will be made. MOB expressed understanding and denies any current or previous CPS history.  MOB reported she resides in the home with her two daughters. MOB stated FOB will not be involved. MOB is currently unemployed, receiving both WIC and food stamps. MOB reported she has a mental health diagnosis of depression. MOB disclosed she was first diagnosed in 2018 after having her first daughter. MOB reported she has never been on medication or went to therapy. MOB stated she copes by smoking cigarettes and cleaning. MOB reported she would not be interested in therapy. MOB reported she is currently feeling a lot better than when she was pregnant. MOB denies any SI, HI or DV and was observed smiling throughout assessment. MOB was appropriate with   newborn, soothing him when needed. MOB stated she has a good support system, identifying her sisters supports.  CSW provided education regarding the baby blues period vs. perinatal mood disorders, discussed treatment and gave resources for mental health follow up if concerns arise.  CSW recommends self-evaluation during the postpartum time period using the New Mom Checklist from Postpartum Progress and encouraged MOB to contact a medical professional if symptoms are noted at any time.   CSW provided review of Sudden Infant Death Syndrome (SIDS) precautions.  MOB reported newborn will sleep in a co-sleeper bassinet. MOB stated she has all of the essential needs for newborn, including a carseat. MOB  is still identifying a pediatrician for baby. MOB disclosed she has some transportation barriers. CSW expressed understanding and provided MOB with information for Medicaid transport. CSW informed MOB that once a pediatrician is identified, [facility] can be scheduled for a ride to the follow-up appointment. CSW will also contact MOB with a ride home using [facility]. MOB expressed understanding and completed a [facility] Waiver which was placed in chart. MOB provided permission for CSW to complete a referral to [facility], as an additional resources once MOB returns home.   CSW made report to [place] CPS. CSW will continue to follow CDS and make an additional report if warranted. CSW identifies no further need for intervention and no barriers to discharge at this time.   CSW Plan/Description:  Perinatal Mood and Anxiety Disorder (PMADs) Education,Sudden Infant Death Syndrome (SIDS) Education,No Further Intervention Required/No Barriers to Discharge,Hospital Drug Screen Policy Information,Child Protective Service Report ,CSW Will Continue to Monitor Umbilical Cord Tissue Drug Screen Results and Make Report if Warranted,Other Information/Referral to Community Resources    Sione Baumgarten J Ellenor Wisniewski, LCSWA 03/20/2020, 9:47 AM 

## 2020-03-23 ENCOUNTER — Other Ambulatory Visit: Payer: Self-pay | Admitting: Obstetrics & Gynecology

## 2020-03-23 MED ORDER — IBUPROFEN 600 MG PO TABS: 600.0000 mg | ORAL_TABLET | Freq: Four times a day (QID) | ORAL | 1 refills | Status: DC | PRN

## 2020-03-23 MED ORDER — DOCUSATE SODIUM 100 MG PO CAPS: 100.0000 mg | ORAL_CAPSULE | Freq: Two times a day (BID) | ORAL | 2 refills | Status: DC | PRN

## 2020-03-23 NOTE — Progress Notes (Signed)
Pt wanted ibuprofen and colace sent to another pharmacy.  These sent to [facility].

## 2020-04-17 ENCOUNTER — Ambulatory Visit: Payer: Self-pay | Admitting: Advanced Practice Midwife

## 2020-05-24 ENCOUNTER — Encounter (HOSPITAL_COMMUNITY): Payer: Self-pay | Admitting: Emergency Medicine

## 2020-05-24 ENCOUNTER — Emergency Department (HOSPITAL_COMMUNITY)
Admission: EM | Admit: 2020-05-24 | Discharge: 2020-05-25 | Disposition: A | Discharge status: Home / Self Care | Payer: Medicaid Other | Attending: Emergency Medicine | Admitting: Emergency Medicine

## 2020-05-24 ENCOUNTER — Other Ambulatory Visit: Payer: Self-pay

## 2020-05-24 DIAGNOSIS — M545 Low back pain, unspecified: Secondary | ICD-10-CM | POA: Diagnosis not present

## 2020-05-24 DIAGNOSIS — N390 Urinary tract infection, site not specified: Secondary | ICD-10-CM | POA: Insufficient documentation

## 2020-05-24 DIAGNOSIS — R300 Dysuria: Secondary | ICD-10-CM | POA: Diagnosis present

## 2020-05-24 DIAGNOSIS — F17210 Nicotine dependence, cigarettes, uncomplicated: Secondary | ICD-10-CM | POA: Diagnosis not present

## 2020-05-24 LAB — CBC
HCT: 44.4 % (ref 36.0–46.0)
Hemoglobin: 14.8 g/dL (ref 12.0–15.0)
MCH: 31 pg (ref 26.0–34.0)
MCHC: 33.3 g/dL (ref 30.0–36.0)
MCV: 92.9 fL (ref 80.0–100.0)
Platelets: 244 10*3/uL (ref 150–400)
RBC: 4.78 MIL/uL (ref 3.87–5.11)
RDW: 13.2 % (ref 11.5–15.5)
WBC: 4 10*3/uL (ref 4.0–10.5)
nRBC: 0 % (ref 0.0–0.2)

## 2020-05-24 LAB — BASIC METABOLIC PANEL
Anion gap: 8 (ref 5–15)
BUN: 10 mg/dL (ref 6–20)
CO2: 22 mmol/L (ref 22–32)
Calcium: 9.2 mg/dL (ref 8.9–10.3)
Chloride: 107 mmol/L (ref 98–111)
Creatinine, Ser: 1.06 mg/dL — ABNORMAL HIGH (ref 0.44–1.00)
GFR, Estimated: >60 mL/min (ref >60)
Glucose, Bld: 98 mg/dL (ref 70–99)
Potassium: 3.9 mmol/L (ref 3.5–5.1)
Sodium: 137 mmol/L (ref 135–145)

## 2020-05-24 MED ORDER — IBUPROFEN 400 MG PO TABS
600.0000 mg | ORAL_TABLET | Freq: Once | ORAL | Status: AC
  Administered 2020-05-24: 600 mg via ORAL
  Filled 2020-05-24: qty 1

## 2020-05-24 NOTE — ED Triage Notes (Signed)
Pt reports having back pain, dysuria, malodorous urine for the last several days. Denies recent fever but having chills.

## 2020-05-24 NOTE — ED Provider Notes (Signed)
[facility] EMERGENCY DEPARTMENT Provider Note   CSN: [id number] Arrival date & time: 05/24/20  1943     History Chief Complaint  Patient presents with  . Dysuria    Jamie [name] is a 25 y.o. female.  Jamie [name] is a 25 y.o. female with hx of recent pregnancy, who presents for evaluation of 4 days of dysuria and urinary frequency. Patient reports symptoms have been constant and worsening. Dysuria is associated with some low back pain but no abdominal or flank pain. No hematuria noted. Reports history of UTIs and this feels similar. Has had pyelonephritis once before.  Denies any fevers or chills.  Reports felt a bit nauseated earlier today but this has resolved, no vomiting.  No vaginal bleeding or vaginal discharge.  Patient is not currently breast-feeding.  Has her 54-month-old here with her.  No medications prior to arrival, did receive a dose of ibuprofen in triage with significant improvement in her discomfort.        Past Medical History:  Diagnosis Date  . Breast lump     Patient Active Problem List   Diagnosis Date Noted  . Atypical squamous cells of undetermined significance (ASCUS) on Papanicolaou smear of cervix 03/19/2020  . Marijuana use 03/19/2020  . Tobacco abuse 03/19/2020  . Anxiety 03/19/2020  . Vaginal delivery 03/19/2020  . Supervision of high risk pregnancy, antepartum 02/14/2020  . IUGR (intrauterine growth restriction) affecting care of mother 02/14/2020    History reviewed. No pertinent surgical history.   OB History    Gravida  4   Para  3   Term  3   Preterm  0   AB  1   Living  3     SAB  1   IAB  0   Ectopic  0   Multiple  0   Live Births  3           Family History  Problem Relation Age of Onset  . Hypertension Mother   . Hypertension Maternal Grandmother     Social History   Tobacco Use  . Smoking status: Current Every Day Smoker    Packs/day: 1.00     Types: Cigarettes  . Smokeless tobacco: Never Used  Vaping Use  . Vaping Use: Never used  Substance Use Topics  . Alcohol use: Never  . Drug use: Never    Home Medications Prior to Admission medications   Medication Sig Start Date End Date Taking? Authorizing Provider  docusate sodium (COLACE) 100 MG capsule Take 1 capsule (100 mg total) by mouth 2 (two) times daily as needed. 03/23/20   Lesly Dukes, MD  ibuprofen (ADVIL) 600 MG tablet Take 1 tablet (600 mg total) by mouth every 6 (six) hours as needed. 03/23/20   Lesly Dukes, MD  Prenatal Vit-Fe Fumarate-FA (M-NATAL PLUS) 27-1 MG TABS Take by mouth.  11/03/19   [provider]    Allergies    Patient has no known allergies.  Review of Systems   Review of Systems  Constitutional: Positive for chills. Negative for fever.  HENT: Negative.   Respiratory: Negative for cough and shortness of breath.   Cardiovascular: Negative for chest pain.  Gastrointestinal: Positive for nausea. Negative for abdominal pain and vomiting.  Genitourinary: Positive for dysuria and frequency. Negative for flank pain, hematuria, pelvic pain, vaginal bleeding and vaginal discharge.  Musculoskeletal: Positive for back pain.  Skin: Negative for color change and rash.  Neurological: Negative for dizziness, syncope and light-headedness.  All other systems reviewed and are negative.   Physical Exam Updated Vital Signs BP 118/85 (BP Location: Right Arm)   Pulse 100   Temp 99 F (37.2 C) (Oral)   Resp 16   Ht 5\' 9"  (1.753 m)   Wt 56.7 kg   SpO2 100%   BMI 18.46 kg/m   Physical Exam Vitals and nursing note reviewed.  Constitutional:      General: She is not in acute distress.    Appearance: Normal appearance. She is well-developed and well-nourished. She is not ill-appearing or diaphoretic.     Comments: Well-appearing and in no distress   HENT:     Head: Normocephalic and atraumatic.     Mouth/Throat:     Mouth: Oropharynx  is clear and moist.  Eyes:     General:        Right eye: No discharge.        Left eye: No discharge.     Extraocular Movements: EOM normal.  Cardiovascular:     Rate and Rhythm: Normal rate and regular rhythm.     Pulses: Intact distal pulses.     Heart sounds: Normal heart sounds. No murmur heard. No friction rub. No gallop.   Pulmonary:     Effort: Pulmonary effort is normal. No respiratory distress.     Breath sounds: Normal breath sounds. No wheezing or rales.     Comments: Respirations equal and unlabored, patient able to speak in full sentences, lungs clear to auscultation bilaterally  Abdominal:     General: Bowel sounds are normal. There is no distension.     Palpations: Abdomen is soft. There is no mass.     Tenderness: There is no abdominal tenderness. There is no guarding.     Comments: Abdomen soft, nondistended, nontender to palpation in all quadrants without guarding or peritoneal signs, no CVA tenderness bilaterally  Musculoskeletal:        General: No deformity or edema.     Cervical back: Neck supple.     Comments: No tenderness over low back  Skin:    General: Skin is warm and dry.     Capillary Refill: Capillary refill takes less than 2 seconds.  Neurological:     Mental Status: She is alert.     Coordination: Coordination normal.     Comments: Speech is clear, able to follow commands Moves extremities without ataxia, coordination intact  Psychiatric:        Mood and Affect: Mood normal.        Behavior: Behavior normal.     ED Results / Procedures / Treatments   Labs (all labs ordered are listed, but only abnormal results are displayed) Labs Reviewed  BASIC METABOLIC PANEL - Abnormal; Notable for the following components:      Result Value   Creatinine, Ser 1.06 (*)    All other components within normal limits  URINE CULTURE  CBC  URINALYSIS, ROUTINE W REFLEX MICROSCOPIC  PREGNANCY, URINE    EKG None  Radiology No results  found.  Procedures Procedures   Medications Ordered in ED Medications  ibuprofen (ADVIL) tablet 600 mg (600 mg Oral Given 05/24/20 1957)    ED Course  I have reviewed the triage vital signs and the nursing notes.  Pertinent labs & imaging results that were available during my care of the patient were reviewed by me and considered in my medical decision making (see chart for details).  MDM Rules/Calculators/A&P                         Pt has been diagnosed with a UTI. Pt is afebrile, no CVA tenderness, normotensive, and had some nausea previously, no emesis, tolerating PO. Lab work reviewed no leukocytosis, normal electrolytes, minimal increase in creatinine. Pt given IM dose of rocephin given history of pyelonephritis, but is very well appearing and appropriate for outpatient treatment.  Pt to be dc home with antibiotics and instructions to follow up with PCP if symptoms persist. Return precautions provided.  Final Clinical Impression(s) / ED Diagnoses Final diagnoses:  Acute UTI    Rx / DC Orders ED Discharge Orders         Ordered    cephALEXin (KEFLEX) 500 MG capsule  3 times daily        05/25/20 0106    ibuprofen (ADVIL) 600 MG tablet  Every 6 hours PRN        05/25/20 0106           Dartha Lodge, PA-C 05/25/20 2326    [name], MD 05/26/20 [phone]

## 2020-05-25 LAB — URINALYSIS, ROUTINE W REFLEX MICROSCOPIC
Bilirubin Urine: NEGATIVE
Glucose, UA: NEGATIVE mg/dL
Ketones, ur: NEGATIVE mg/dL
Nitrite: POSITIVE — AB
Protein, ur: 100 mg/dL — AB
Specific Gravity, Urine: 1.021 (ref 1.005–1.030)
WBC, UA: >50 WBC/hpf — ABNORMAL HIGH (ref 0–5)
pH: 5 (ref 5.0–8.0)

## 2020-05-25 LAB — PREGNANCY, URINE: Preg Test, Ur: NEGATIVE

## 2020-05-25 MED ORDER — CEPHALEXIN 500 MG PO CAPS: 500.0000 mg | ORAL_CAPSULE | Freq: Three times a day (TID) | ORAL | 0 refills | Status: AC

## 2020-05-25 MED ORDER — IBUPROFEN 600 MG PO TABS: 600.0000 mg | ORAL_TABLET | Freq: Four times a day (QID) | ORAL | 0 refills | Status: DC | PRN

## 2020-05-25 MED ORDER — LIDOCAINE HCL (PF) 1 % IJ SOLN
INTRAMUSCULAR | Status: AC
  Filled 2020-05-25: qty 5

## 2020-05-25 MED ORDER — CEFTRIAXONE SODIUM 1 G IJ SOLR
1.0000 g | Freq: Once | INTRAMUSCULAR | Status: AC
  Administered 2020-05-25: 1 g via INTRAMUSCULAR
  Filled 2020-05-25: qty 10

## 2020-05-25 NOTE — Discharge Instructions (Signed)
You have a UTI, you were given a shot of antibiotics today and should take Keflex 3 times daily for the next 7 days, it is very important that you take entire course of antibiotics even if your symptoms resolve.  You may use prescribed ibuprofen every 6 hours as needed for pain you can also take Tylenol 1000 mg as needed for pain.  Make sure you are drinking plenty of fluids.  Follow-up with your primary care provider.  If you develop fevers, flank pain, vomiting or any other new or concerning symptoms return for reevaluation.

## 2020-05-27 LAB — URINE CULTURE: Culture: >=100000 — AB

## 2020-08-30 ENCOUNTER — Other Ambulatory Visit: Payer: Self-pay

## 2020-08-30 ENCOUNTER — Emergency Department (HOSPITAL_COMMUNITY)
Admission: EM | Admit: 2020-08-30 | Discharge: 2020-08-30 | Disposition: A | Discharge status: Home / Self Care | Payer: Medicaid Other | Attending: Emergency Medicine | Admitting: Emergency Medicine

## 2020-08-30 ENCOUNTER — Encounter (HOSPITAL_COMMUNITY): Payer: Self-pay | Admitting: Emergency Medicine

## 2020-08-30 DIAGNOSIS — N898 Other specified noninflammatory disorders of vagina: Secondary | ICD-10-CM | POA: Diagnosis present

## 2020-08-30 DIAGNOSIS — A599 Trichomoniasis, unspecified: Secondary | ICD-10-CM | POA: Diagnosis not present

## 2020-08-30 DIAGNOSIS — M545 Low back pain, unspecified: Secondary | ICD-10-CM | POA: Insufficient documentation

## 2020-08-30 DIAGNOSIS — F17210 Nicotine dependence, cigarettes, uncomplicated: Secondary | ICD-10-CM | POA: Insufficient documentation

## 2020-08-30 DIAGNOSIS — B9689 Other specified bacterial agents as the cause of diseases classified elsewhere: Secondary | ICD-10-CM | POA: Insufficient documentation

## 2020-08-30 DIAGNOSIS — N760 Acute vaginitis: Secondary | ICD-10-CM | POA: Insufficient documentation

## 2020-08-30 LAB — URINALYSIS, ROUTINE W REFLEX MICROSCOPIC
Bilirubin Urine: NEGATIVE
Glucose, UA: NEGATIVE mg/dL
Hgb urine dipstick: NEGATIVE
Ketones, ur: NEGATIVE mg/dL
Leukocytes,Ua: NEGATIVE
Nitrite: NEGATIVE
Protein, ur: NEGATIVE mg/dL
Specific Gravity, Urine: 1.023 (ref 1.005–1.030)
pH: 5 (ref 5.0–8.0)

## 2020-08-30 LAB — WET PREP, GENITAL
Sperm: NONE SEEN
WBC, Wet Prep HPF POC: NONE SEEN
Yeast Wet Prep HPF POC: NONE SEEN

## 2020-08-30 NOTE — Discharge Instructions (Addendum)
He came to the emerge department today to be evaluated for your vaginal discharge and low back pain.  Your physical exam was reassuring.  Your low back pain is likely musculoskeletal in nature.  I have given you a prescription for lidocaine patches.  Please apply 1 patch every 12 hours as needed.  You may also use over-the-counter pain medication to help with your symptoms.  Your white test prep was positive for bacterial vaginosis and trichomonas.  I have started you on antibiotic Flagyl.  Please take 1 pill twice a day for the next 7 days.  It is very important that you do not consume any alcohol while taking this medication as it will cause you to become violently ill.   You may have diarrhea from the antibiotics.  It is very important that you continue to take the antibiotics even if you get diarrhea unless a medical professional tells you that you may stop taking them.  If you stop too early the bacteria you are being treated for will become stronger and you may need different, more powerful antibiotics that have more side effects and worsening diarrhea.  Please stay well hydrated and consider probiotics as they may decrease the severity of your diarrhea.  Please be aware that if you take any hormonal contraception (birth control pills, nexplanon, the ring, etc) that your birth control will not work while you are taking antibiotics and you need to use back up protection as directed on the birth control medication information insert.   Please take Ibuprofen (Advil, motrin) and Tylenol (acetaminophen) to relieve your pain.    You may take up to 600 MG (3 pills) of normal strength ibuprofen every 8 hours as needed.   You make take tylenol, up to 1,000 mg (two extra strength pills) every 8 hours as needed.   It is safe to take ibuprofen and tylenol at the same time as they work differently.   Do not take more than 3,000 mg tylenol in a 24 hour period (not more than one dose every 8 hours.  Please check  all medication labels as many medications such as pain and cold medications may contain tylenol.  Do not drink alcohol while taking these medications.  Do not take other NSAID'S while taking ibuprofen (such as aleve or naproxen).  Please take ibuprofen with food to decrease stomach upset.  Your gonorrhea and Chlamydia testing is pending at this time.  You will be contacted if your results are positive.  If your results are positive please go immediately to [facility] department, and urgent care, or return to the emergency department for treatment.  Get help right away if: You develop new bowel or bladder control problems. You have unusual weakness or numbness in your arms or legs. You develop nausea or vomiting. You develop abdominal pain. You feel faint.

## 2020-08-30 NOTE — ED Triage Notes (Signed)
Patient reports white vaginal discharge with lower back pain x2 weeks.

## 2020-08-30 NOTE — ED Provider Notes (Signed)
[facility]-EMERGENCY DEPT Provider Note   CSN: [id number] Arrival date & time: 08/30/20  1930     History Chief Complaint  Patient presents with  . Vaginal Discharge    Jamie CapesKelly Shawnderay Ellouise NewerJanette [name] is a 25 y.o. female presents with a chief complaint of vaginal discharge and lumbar back pain.  Patient reports that her vaginal discharge has been present for the last week.  Patient describes vaginal discharge as white.  Patient endorses malodorous smell to discharge.  Patient has tried no over-the-counter medications or creams to help with her discharge.  Patient denies vaginal pain, vaginal discharge, hematuria, dysuria.  Patient reports that lumbar back pain has been present for 1 week.  Patient rates pain 7/10 on pain scale.  Patient denies any radiation of pain.  Pain is worse with bending and lifting.  Pain is improved at rest.  Patient has tried no over-the-counter medications or treatments to alleviate her pain.  Patient denies any recent falls or traumatic injuries.  Patient denies any heavy lifting however has a 23-year-old, 23-year-old, and 9043-month-old that she frequently lifts.  Patient denies any numbness to extremities, weakness of extremities, saddle anesthesia, bowel or bladder dysfunction, neck pain, visual disturbance.  HPI     Past Medical History:  Diagnosis Date  . Breast lump     Patient Active Problem List   Diagnosis Date Noted  . Atypical squamous cells of undetermined significance (ASCUS) on Papanicolaou smear of cervix 03/19/2020  . Marijuana use 03/19/2020  . Tobacco abuse 03/19/2020  . Anxiety 03/19/2020  . Vaginal delivery 03/19/2020  . Supervision of high risk pregnancy, antepartum 02/14/2020  . IUGR (intrauterine growth restriction) affecting care of mother 02/14/2020    History reviewed. No pertinent surgical history.   OB History    Gravida  4   Para  3   Term  3   Preterm  0   AB  1   Living  3     SAB  1    IAB  0   Ectopic  0   Multiple  0   Live Births  3           Family History  Problem Relation Age of Onset  . Hypertension Mother   . Hypertension Maternal Grandmother     Social History   Tobacco Use  . Smoking status: Current Every Day Smoker    Packs/day: 1.00    Types: Cigarettes  . Smokeless tobacco: Never Used  Vaping Use  . Vaping Use: Never used  Substance Use Topics  . Alcohol use: Never  . Drug use: Never    Home Medications Prior to Admission medications   Medication Sig Start Date End Date Taking? Authorizing Provider  ibuprofen (ADVIL) 600 MG tablet Take 1 tablet (600 mg total) by mouth every 6 (six) hours as needed. 05/25/20   Dartha LodgeFord, Kelsey N, PA-C    Allergies    Patient has no known allergies.  Review of Systems   Review of Systems  Constitutional: Negative for chills and fever.  Eyes: Negative for visual disturbance.  Respiratory: Negative for shortness of breath.   Cardiovascular: Negative for chest pain.  Gastrointestinal: Negative for abdominal pain, nausea and vomiting.  Genitourinary: Positive for vaginal discharge. Negative for difficulty urinating, dysuria, frequency, genital sores, pelvic pain, vaginal bleeding and vaginal pain.  Musculoskeletal: Positive for back pain. Negative for gait problem and neck pain.  Skin: Negative for color change, pallor, rash and wound.  Neurological:  Negative for dizziness, syncope, light-headedness and headaches.  Psychiatric/Behavioral: Negative for confusion.    Physical Exam Updated Vital Signs BP 110/73 (BP Location: Left Arm)   Pulse 84   Temp 98.1 F (36.7 C) (Oral)   Resp 18   SpO2 99%   Physical Exam Vitals and nursing note reviewed. Exam conducted with a chaperone present (Female RN present as chaperone).  Constitutional:      General: She is not in acute distress.    Appearance: She is not ill-appearing, toxic-appearing or diaphoretic.  HENT:     Head: Normocephalic and atraumatic.   Eyes:     General: No scleral icterus.       Right eye: No discharge.        Left eye: No discharge.     Extraocular Movements: Extraocular movements intact.     Pupils: Pupils are equal, round, and reactive to light.  Cardiovascular:     Rate and Rhythm: Normal rate.  Pulmonary:     Effort: Pulmonary effort is normal. No tachypnea, bradypnea or respiratory distress.  Abdominal:     General: Abdomen is flat. There is no distension. There are no signs of injury.     Palpations: Abdomen is soft. There is no mass or pulsatile mass.     Tenderness: There is no abdominal tenderness. There is no right CVA tenderness or left CVA tenderness.     Hernia: There is no hernia in the left inguinal area or right inguinal area.  Genitourinary:    Exam position: Lithotomy position.     Pubic Area: No rash or pubic lice.      Tanner stage (genital): 5.     Labia:        Right: No rash, tenderness, lesion or injury.        Left: No rash, tenderness, lesion or injury.      Vagina: No signs of injury and foreign body. Vaginal discharge present. No erythema, tenderness, bleeding, lesions or prolapsed vaginal walls.     Cervix: Normal.     Uterus: Not enlarged and not tender.      Adnexa: Right adnexa normal and left adnexa normal.     Comments: Copious amounts of white malodorous discharge noted in vaginal vault and below vulva. Musculoskeletal:     Cervical back: Normal range of motion and neck supple. No swelling, edema, deformity, erythema, signs of trauma, lacerations, rigidity, spasms, torticollis, tenderness, bony tenderness or crepitus. No pain with movement, spinous process tenderness or muscular tenderness. Normal range of motion.     Thoracic back: No swelling, edema, deformity, signs of trauma, lacerations, spasms, tenderness or bony tenderness. Normal range of motion.     Lumbar back: No swelling, edema, deformity, signs of trauma, lacerations, spasms, tenderness or bony tenderness. Normal  range of motion.     Comments: No deformity or midline tenderness to cervical, thoracic, or lumbar spine  Patient has no tenderness to lumbar back   Lymphadenopathy:     Lower Body: No right inguinal adenopathy. No left inguinal adenopathy.  Skin:    General: Skin is warm and dry.     Coloration: Skin is not pale.  Neurological:     General: No focal deficit present.     Mental Status: She is alert and oriented to person, place, and time.     GCS: GCS eye subscore is 4. GCS verbal subscore is 5. GCS motor subscore is 6.     Cranial Nerves: No cranial nerve deficit  or facial asymmetry.     Sensory: Sensation is intact.     Motor: No weakness, tremor or seizure activity.     Gait: Gait is intact. Gait normal.     Comments: CN II-XII intact, equal grip strength, +5 strength to bilateral upper and lower extremities, sensation to light touch intact to bilateral upper and lower extremities  Patient able to stand and ambulate without difficulty.  Patient able to lift and hold her children without difficulty.     Psychiatric:        Behavior: Behavior is cooperative.     ED Results / Procedures / Treatments   Labs (all labs ordered are listed, but only abnormal results are displayed) Labs Reviewed  WET PREP, GENITAL - Abnormal; Notable for the following components:      Result Value   Trich, Wet Prep PRESENT (*)    Clue Cells Wet Prep HPF POC PRESENT (*)    All other components within normal limits  URINALYSIS, ROUTINE W REFLEX MICROSCOPIC - Abnormal; Notable for the following components:   APPearance HAZY (*)    All other components within normal limits  GC/CHLAMYDIA PROBE AMP ([place]) NOT AT [facility]    EKG None  Radiology No results found.  Procedures Procedures   Medications Ordered in ED Medications - No data to display  ED Course  I have reviewed the triage vital signs and the nursing notes.  Pertinent labs & imaging results that were available during my care  of the patient were reviewed by me and considered in my medical decision making (see chart for details).  Clinical Course as of 08/31/20 0012  Thu Aug 30, 2020  2211 Robynn Pane Prep(!): PRESENT [PB]    Clinical Course User Index [PB] Berneice Heinrich   MDM Rules/Calculators/A&P                          Alert 25 year old female no acute distress, nontoxic-appearing.  Patient presents with chief complaint of vaginal discharge and lumbar back pain x1 week.  On physical exam patient has no focal neurological deficit.  No midline tenderness or deformity to cervical, thoracic, or lumbar spine.  Patient has no tenderness to lumbar back.  Patient denies any saddle anesthesia, bowel or bladder dysfunction, numbness to extremities, weakness to extremities, fevers, chills.  Low suspicion for cauda equina syndrome or epidural abscess.  Suspect that patient's back pain is musculoskeletal in nature as pain is worse with bending.  Will prescribe patient with lidocaine patch and advised over-the-counter pain medication.  Low suspicion for renal calculus or pyelonephritis as urinalysis shows no signs of infection or hematuria.  Patient has no CVA tenderness.  On physical exam patient has copious amounts of malodorous white discharge noted in vaginal vault and below vulva.  Wet prep shows trichomonas and clue cells present.  We will treat patient with 7-day course of metronidazole to treat for trichomonas and bacterial vaginosis.  Patient given warning on disulfiram reaction.  Gonorrhea and Chlamydia test pending.  Patient advised to seek immediate treatment if contacted with positive result.  Discussed results, findings, treatment and follow up. Patient advised of return precautions. Patient verbalized understanding and agreed with plan.   Final Clinical Impression(s) / ED Diagnoses Final diagnoses:  BV (bacterial vaginosis)  Trichomonas infection  Acute bilateral low back pain without sciatica     Rx / DC Orders ED Discharge Orders  Ordered    lidocaine (LIDODERM) 5 %  Every 24 hours        08/31/20 0022    metroNIDAZOLE (FLAGYL) 500 MG tablet  2 times daily        08/31/20 0022           Haskel Schroeder, PA-C 08/31/20 0131    Vanetta Mulders, MD 09/01/20 [phone]

## 2020-08-30 NOTE — ED Notes (Signed)
An After Visit Summary was printed and given to the patient. Discharge instructions given and no further questions at this time.  

## 2020-08-31 LAB — GC/CHLAMYDIA PROBE AMP (~~LOC~~) NOT AT ARMC
Chlamydia: NEGATIVE
Comment: NEGATIVE
Comment: NORMAL
Neisseria Gonorrhea: NEGATIVE

## 2020-08-31 MED ORDER — METRONIDAZOLE 500 MG PO TABS: 500.0000 mg | ORAL_TABLET | Freq: Two times a day (BID) | ORAL | 0 refills | Status: DC

## 2020-08-31 MED ORDER — LIDOCAINE 5 % EX PTCH: 1.0000 | MEDICATED_PATCH | CUTANEOUS | 0 refills | Status: DC

## 2020-11-10 ENCOUNTER — Emergency Department (HOSPITAL_COMMUNITY)
Admission: EM | Admit: 2020-11-10 | Discharge: 2020-11-10 | Disposition: A | Discharge status: Home / Self Care | Payer: Medicaid Other | Attending: Emergency Medicine | Admitting: Emergency Medicine

## 2020-11-10 DIAGNOSIS — X58XXXA Exposure to other specified factors, initial encounter: Secondary | ICD-10-CM | POA: Insufficient documentation

## 2020-11-10 DIAGNOSIS — S0501XA Injury of conjunctiva and corneal abrasion without foreign body, right eye, initial encounter: Secondary | ICD-10-CM | POA: Diagnosis not present

## 2020-11-10 DIAGNOSIS — F17210 Nicotine dependence, cigarettes, uncomplicated: Secondary | ICD-10-CM | POA: Diagnosis not present

## 2020-11-10 DIAGNOSIS — S0591XA Unspecified injury of right eye and orbit, initial encounter: Secondary | ICD-10-CM | POA: Diagnosis present

## 2020-11-10 MED ORDER — ERYTHROMYCIN 5 MG/GM OP OINT: TOPICAL_OINTMENT | OPHTHALMIC | 0 refills | Status: DC

## 2020-11-10 MED ORDER — IBUPROFEN 800 MG PO TABS
800.0000 mg | ORAL_TABLET | Freq: Once | ORAL | Status: AC
  Administered 2020-11-10: 800 mg via ORAL
  Filled 2020-11-10: qty 2

## 2020-11-10 MED ORDER — TETRACAINE HCL 0.5 % OP SOLN
2.0000 [drp] | Freq: Once | OPHTHALMIC | Status: AC
  Administered 2020-11-10: 2 [drp] via OPHTHALMIC
  Filled 2020-11-10: qty 4

## 2020-11-10 MED ORDER — FLUORESCEIN SODIUM 1 MG OP STRP
1.0000 | ORAL_STRIP | Freq: Once | OPHTHALMIC | Status: AC
  Administered 2020-11-10: 1 via OPHTHALMIC
  Filled 2020-11-10: qty 1

## 2020-11-10 NOTE — ED Triage Notes (Signed)
Pt reported to ED with c/o pain to both eyes that is worse on right. Reports sleeping with contacts in, states vision is blurred in both eyes.

## 2020-11-10 NOTE — ED Provider Notes (Signed)
[facility] EMERGENCY DEPARTMENT Provider Note   CSN: [id number] Arrival date & time: 11/10/20  2017     History No chief complaint on file.   Jamie [name] is a 25 y.o. female who presents with cc of eye pain. She fell asleep with her contacts in and woke up with severe right eye pain blurry vision in the left eye.  Pain is worse when she opens her eyes and has severe photophobia.  She is never had anything like this before.  She denies any injuries to the eye.  HPI     Past Medical History:  Diagnosis Date   Breast lump     Patient Active Problem List   Diagnosis Date Noted   Atypical squamous cells of undetermined significance (ASCUS) on Papanicolaou smear of cervix 03/19/2020   Marijuana use 03/19/2020   Tobacco abuse 03/19/2020   Anxiety 03/19/2020   Vaginal delivery 03/19/2020   Supervision of high risk pregnancy, antepartum 02/14/2020   IUGR (intrauterine growth restriction) affecting care of mother 02/14/2020    No past surgical history on file.   OB History     Gravida  4   Para  3   Term  3   Preterm  0   AB  1   Living  3      SAB  1   IAB  0   Ectopic  0   Multiple  0   Live Births  3           Family History  Problem Relation Age of Onset   Hypertension Mother    Hypertension Maternal Grandmother     Social History   Tobacco Use   Smoking status: Every Day    Packs/day: 1.00    Types: Cigarettes   Smokeless tobacco: Never  Vaping Use   Vaping Use: Never used  Substance Use Topics   Alcohol use: Never   Drug use: Never    Home Medications Prior to Admission medications   Medication Sig Start Date End Date Taking? Authorizing Provider  ibuprofen (ADVIL) 600 MG tablet Take 1 tablet (600 mg total) by mouth every 6 (six) hours as needed. 05/25/20   Dartha Lodge, PA-C  lidocaine (LIDODERM) 5 % Place 1 patch onto the skin daily. Remove & Discard patch within 12 hours or as directed by  MD 08/31/20   Haskel Schroeder, PA-C  metroNIDAZOLE (FLAGYL) 500 MG tablet Take 1 tablet (500 mg total) by mouth 2 (two) times daily. 08/31/20   Haskel Schroeder, PA-C    Allergies    Patient has no known allergies.  Review of Systems   Review of Systems Ten systems reviewed and are negative for acute change, except as noted in the HPI.   Physical Exam Updated Vital Signs BP 118/79 (BP Location: Left Arm)   Pulse 61   Temp 98.2 F (36.8 C) (Oral)   Resp 18   SpO2 100%   Physical Exam Vitals and nursing note reviewed.  Constitutional:      General: She is not in acute distress.    Appearance: She is well-developed. She is not diaphoretic.  HENT:     Head: Normocephalic and atraumatic.     Right Ear: External ear normal.     Left Ear: External ear normal.     Nose: Nose normal.     Mouth/Throat:     Mouth: Mucous membranes are moist.  Eyes:     General:  No scleral icterus.    Intraocular pressure: Right eye pressure is 11 mmHg. Left eye pressure is 12 mmHg.     Conjunctiva/sclera: Conjunctivae normal.     Right eye: Right conjunctiva is not injected. No chemosis.    Left eye: Left conjunctiva is not injected. No chemosis.    Pupils: Pupils are equal, round, and reactive to light.     Right eye: Corneal abrasion and fluorescein uptake present. Seidel exam negative.     Left eye: No corneal abrasion. Seidel exam negative. Cardiovascular:     Rate and Rhythm: Normal rate and regular rhythm.     Heart sounds: Normal heart sounds. No murmur heard.   No friction rub. No gallop.  Pulmonary:     Effort: Pulmonary effort is normal. No respiratory distress.     Breath sounds: Normal breath sounds.  Abdominal:     General: Bowel sounds are normal. There is no distension.     Palpations: Abdomen is soft. There is no mass.     Tenderness: There is no abdominal tenderness. There is no guarding.  Musculoskeletal:     Cervical back: Normal range of motion.  Skin:     General: Skin is warm and dry.  Neurological:     Mental Status: She is alert and oriented to person, place, and time.  Psychiatric:        Behavior: Behavior normal.    ED Results / Procedures / Treatments   Labs (all labs ordered are listed, but only abnormal results are displayed) Labs Reviewed - No data to display  EKG None  Radiology No results found.  Procedures Procedures   Medications Ordered in ED Medications - No data to display  ED Course  I have reviewed the triage vital signs and the nursing notes.  Pertinent labs & imaging results that were available during my care of the patient were reviewed by me and considered in my medical decision making (see chart for details).    MDM Rules/Calculators/A&P                           Corneal abrasion  Pt with corneal Ulcer on PE.  Eye irrigated w NS, no evidence of FB.  Vision grossly intact. Exam is not concerning for orbital cellulitis, hyphema, Patient will be discharged home with erythromycin.   Patient understands to follow up with ophthalmology, & to return to ER if new symptoms develop including change in vision, purulent drainage, or entrapment.  Final Clinical Impression(s) / ED Diagnoses Final diagnoses:  Abrasion of right cornea, initial encounter    Rx / DC Orders ED Discharge Orders     None        Arthor Captain, PA-C 11/10/20 2246    Ernie Avena, MD 11/11/20 1152

## 2020-11-10 NOTE — Discharge Instructions (Addendum)
Contact a health care provider if: You continue to have eye pain and other symptoms for more than 2 days. You have new symptoms, such as worse redness, tearing, or discharge. You have discharge that makes your eyelids stick together in the morning. Your eye patch becomes so loose that you can blink your eye. Symptoms return after the original abrasion has healed. Get help right away if: You have severe eye pain that does not get better with medicine. You have vision loss. 

## 2021-01-09 ENCOUNTER — Emergency Department (HOSPITAL_COMMUNITY)
Admission: EM | Admit: 2021-01-09 | Discharge: 2021-01-09 | Disposition: A | Discharge status: Home / Self Care | Payer: Medicaid Other | Attending: Emergency Medicine | Admitting: Emergency Medicine

## 2021-01-09 ENCOUNTER — Other Ambulatory Visit: Payer: Self-pay

## 2021-01-09 ENCOUNTER — Encounter (HOSPITAL_COMMUNITY): Payer: Self-pay

## 2021-01-09 DIAGNOSIS — Z5321 Procedure and treatment not carried out due to patient leaving prior to being seen by health care provider: Secondary | ICD-10-CM | POA: Diagnosis not present

## 2021-01-09 DIAGNOSIS — N898 Other specified noninflammatory disorders of vagina: Secondary | ICD-10-CM | POA: Insufficient documentation

## 2021-01-09 NOTE — ED Triage Notes (Signed)
Pt has vaginal discharge that has been going on for months.

## 2021-01-09 NOTE — ED Provider Notes (Signed)
Emergency Medicine Provider Triage Evaluation Note  Jamie [name] Jamie [name] , a 25 y.o. female  was evaluated in triage.  Pt complains of vaginal discharge for several months.  Some discomfort vaginal area.  Denies pregnancy possibility.  States partner without current symptoms.  Review of Systems  Positive: Vaginal discharge Negative: bleeding  Physical Exam  BP 128/85   Pulse (!) 103   Temp 98.6 F (37 C) (Oral)   Resp 18   SpO2 100%   Gen:   Awake, no distress   Resp:  Normal effort  MSK:   Moves extremities without difficulty  Other:    Medical Decision Making  Medically screening exam initiated at 1:52 AM.  Appropriate orders placed.  Jamie [name] Jamie [name] was informed that the remainder of the evaluation will be completed by another provider, this initial triage assessment does not replace that evaluation, and the importance of remaining in the ED until their evaluation is complete.  Vaginal discharge.  UA and u-preg ordered.   Garlon Hatchet, PA-C 01/09/21 0349    Charlynne Pander, MD 01/09/21 [phone]

## 2021-01-09 NOTE — ED Notes (Signed)
Name called 3x for updated vitals, no response

## 2021-01-31 ENCOUNTER — Ambulatory Visit (HOSPITAL_COMMUNITY)
Admission: EM | Admit: 2021-01-31 | Discharge: 2021-01-31 | Disposition: A | Discharge status: Home / Self Care | Payer: Medicaid Other | Attending: Physician Assistant | Admitting: Physician Assistant

## 2021-01-31 ENCOUNTER — Encounter (HOSPITAL_COMMUNITY): Payer: Self-pay | Admitting: *Deleted

## 2021-01-31 ENCOUNTER — Other Ambulatory Visit: Payer: Self-pay

## 2021-01-31 DIAGNOSIS — Z113 Encounter for screening for infections with a predominantly sexual mode of transmission: Secondary | ICD-10-CM | POA: Diagnosis not present

## 2021-01-31 DIAGNOSIS — R1030 Lower abdominal pain, unspecified: Secondary | ICD-10-CM | POA: Diagnosis present

## 2021-01-31 DIAGNOSIS — N898 Other specified noninflammatory disorders of vagina: Secondary | ICD-10-CM | POA: Diagnosis not present

## 2021-01-31 DIAGNOSIS — N390 Urinary tract infection, site not specified: Secondary | ICD-10-CM

## 2021-01-31 DIAGNOSIS — N3000 Acute cystitis without hematuria: Secondary | ICD-10-CM | POA: Diagnosis not present

## 2021-01-31 LAB — POCT URINALYSIS DIPSTICK, ED / UC
Bilirubin Urine: NEGATIVE
Glucose, UA: NEGATIVE mg/dL
Hgb urine dipstick: NEGATIVE
Ketones, ur: NEGATIVE mg/dL
Nitrite: POSITIVE — AB
Protein, ur: NEGATIVE mg/dL
Specific Gravity, Urine: 1.02 (ref 1.005–1.030)
Urobilinogen, UA: 0.2 mg/dL (ref 0.0–1.0)
pH: 6 (ref 5.0–8.0)

## 2021-01-31 LAB — POC URINE PREG, ED: Preg Test, Ur: NEGATIVE

## 2021-01-31 MED ORDER — NITROFURANTOIN MONOHYD MACRO 100 MG PO CAPS: 100.0000 mg | ORAL_CAPSULE | Freq: Two times a day (BID) | ORAL | 0 refills | Status: DC

## 2021-01-31 NOTE — ED Triage Notes (Signed)
Pt reports ABD pain off and on for a couple of months. Pt also reports Vag discharge.

## 2021-01-31 NOTE — ED Provider Notes (Signed)
[facility]    CSN: [id number] Arrival date & time: 01/31/21  1421      History   Chief Complaint Chief Complaint  Patient presents with   Abdominal Pain    HPI Jamie [name] is a 25 y.o. female.   Patient presents today with a several month history of intermittent lower abdominal pain with associated vaginal discharge.  During episodes pain is rated 8 on a 0-10 pain scale, localized to lower abdomen, described as cramping, no aggravating relieving factors identified.  She is currently asymptomatic and denies any current abdominal pain or pelvic pain.  She describes vaginal discharge as copious, clear, malodorous.  She reports occasional nausea but denies any emesis, severe abdominal pain, pelvic pain, abnormal vaginal bleeding, UTI symptoms, changes in bowel habits, fever.  She has not tried any over-the-counter medication for symptom management.  She denies any change to personal hygiene products including soaps or detergents.  Denies any recent antibiotic use.  She does not believe she is pregnant but is interested in testing.  She is not currently breast-feeding.     Past Medical History:  Diagnosis Date   Breast lump     Patient Active Problem List   Diagnosis Date Noted   Atypical squamous cells of undetermined significance (ASCUS) on Papanicolaou smear of cervix 03/19/2020   Marijuana use 03/19/2020   Tobacco abuse 03/19/2020   Anxiety 03/19/2020   Vaginal delivery 03/19/2020   Supervision of high risk pregnancy, antepartum 02/14/2020   IUGR (intrauterine growth restriction) affecting care of mother 02/14/2020    History reviewed. No pertinent surgical history.  OB History     Gravida  4   Para  3   Term  3   Preterm  0   AB  1   Living  3      SAB  1   IAB  0   Ectopic  0   Multiple  0   Live Births  3            Home Medications    Prior to Admission medications   Medication Sig Start Date End Date  Taking? Authorizing Provider  nitrofurantoin, macrocrystal-monohydrate, (MACROBID) 100 MG capsule Take 1 capsule (100 mg total) by mouth 2 (two) times daily. 01/31/21  Yes Kenechukwu Eckstein, Noberto Retort, PA-C    Family History Family History  Problem Relation Age of Onset   Hypertension Mother    Hypertension Maternal Grandmother     Social History Social History   Tobacco Use   Smoking status: Every Day    Packs/day: 1.00    Types: Cigarettes   Smokeless tobacco: Never  Vaping Use   Vaping Use: Never used  Substance Use Topics   Alcohol use: Never   Drug use: Never     Allergies   Patient has no known allergies.   Review of Systems Review of Systems  Constitutional:  Positive for activity change. Negative for appetite change, fatigue and fever.  Respiratory:  Negative for cough and shortness of breath.   Cardiovascular:  Negative for chest pain.  Gastrointestinal:  Negative for abdominal pain, diarrhea, nausea and vomiting.  Genitourinary:  Positive for vaginal discharge. Negative for dysuria, flank pain, frequency, hematuria, urgency, vaginal bleeding and vaginal pain.  Musculoskeletal:  Negative for arthralgias and myalgias.  Neurological:  Negative for dizziness, light-headedness and headaches.    Physical Exam Triage Vital Signs ED Triage Vitals  Enc Vitals Group     BP 01/31/21 1624 Marland Kitchen)  152/88     Pulse Rate 01/31/21 1624 61     Resp 01/31/21 1624 16     Temp 01/31/21 1624 98.7 F (37.1 C)     Temp Source 01/31/21 1624 Oral     SpO2 01/31/21 1624 99 %     Weight --      Height --      Head Circumference --      Peak Flow --      Pain Score 01/31/21 1621 8     Pain Loc --      Pain Edu? --      Excl. in GC? --    No data found.  Updated Vital Signs BP (!) 152/88 (BP Location: Right Arm)   Pulse 61   Temp 98.7 F (37.1 C) (Oral)   Resp 16   LMP 01/05/2021   SpO2 99%   Visual Acuity Right Eye Distance:   Left Eye Distance:   Bilateral Distance:     Right Eye Near:   Left Eye Near:    Bilateral Near:     Physical Exam Vitals reviewed.  Constitutional:      General: She is awake. She is not in acute distress.    Appearance: Normal appearance. She is well-developed. She is not ill-appearing.     Comments: Very pleasant female appears stated age no acute distress  HENT:     Head: Normocephalic and atraumatic.  Cardiovascular:     Rate and Rhythm: Normal rate and regular rhythm.     Heart sounds: Normal heart sounds, S1 normal and S2 normal. No murmur heard. Pulmonary:     Effort: Pulmonary effort is normal.     Breath sounds: Normal breath sounds. No wheezing, rhonchi or rales.     Comments: Clear to auscultation bilaterally Abdominal:     General: Bowel sounds are normal.     Palpations: Abdomen is soft.     Tenderness: There is no abdominal tenderness. There is no right CVA tenderness, left CVA tenderness, guarding or rebound.     Comments: Benign abdominal exam  Genitourinary:    Comments: Exam deferred Psychiatric:        Behavior: Behavior is cooperative.     UC Treatments / Results  Labs (all labs ordered are listed, but only abnormal results are displayed) Labs Reviewed  POCT URINALYSIS DIPSTICK, ED / UC - Abnormal; Notable for the following components:      Result Value   Nitrite POSITIVE (*)    Leukocytes,Ua TRACE (*)    All other components within normal limits  URINE CULTURE  POC URINE PREG, ED  CERVICOVAGINAL ANCILLARY ONLY    EKG   Radiology No results found.  Procedures Procedures (including critical care time)  Medications Ordered in UC Medications - No data to display  Initial Impression / Assessment and Plan / UC Course  I have reviewed the triage vital signs and the nursing notes.  Pertinent labs & imaging results that were available during my care of the patient were reviewed by me and considered in my medical decision making (see chart for details).     UA positive for nitrates  and leukocyte esterase consistent with urinary tract infection.  We will start patient on Macrobid.  She had negative urine pregnancy in clinic reports she is not breast-feeding.  We will send urine off for culture and we discussed potential need to change antibiotics based on susceptibilities identified on culture.  She is to rest and drink  plenty of fluid.  STI swab was collected today-results pending.  Will defer treatment until results are obtained.  Discussed alarm symptoms that warrant emergent evaluation.  Strict return precautions given to which she expressed understanding.  Final Clinical Impressions(s) / UC Diagnoses   Final diagnoses:  Lower urinary tract infectious disease  Acute cystitis without hematuria  Lower abdominal pain  Routine screening for STI (sexually transmitted infection)  Vaginal discharge     Discharge Instructions      Your urine pregnancy test was negative.  Your urine does appear as though you have a urinary tract infection.  Please start Macrobid twice daily.  We will contact you if we need to change antibiotics based on your culture results.  We will also contact you if we need to arrange any testing based on your swab.  Please make sure you drink plenty of fluid.  If you have any worsening symptoms including pelvic pain, abdominal pain, nausea, vomiting you need to be seen immediately.     ED Prescriptions     Medication Sig Dispense Auth. Provider   nitrofurantoin, macrocrystal-monohydrate, (MACROBID) 100 MG capsule Take 1 capsule (100 mg total) by mouth 2 (two) times daily. 10 capsule Fionna Merriott, Noberto Retort, PA-C      PDMP not reviewed this encounter.   Jeani Hawking, PA-C 01/31/21 1707

## 2021-01-31 NOTE — Discharge Instructions (Signed)
Your urine pregnancy test was negative.  Your urine does appear as though you have a urinary tract infection.  Please start Macrobid twice daily.  We will contact you if we need to change antibiotics based on your culture results.  We will also contact you if we need to arrange any testing based on your swab.  Please make sure you drink plenty of fluid.  If you have any worsening symptoms including pelvic pain, abdominal pain, nausea, vomiting you need to be seen immediately.

## 2021-02-01 LAB — CERVICOVAGINAL ANCILLARY ONLY
Bacterial Vaginitis (gardnerella): POSITIVE — AB
Candida Glabrata: NEGATIVE
Candida Vaginitis: NEGATIVE
Chlamydia: NEGATIVE
Comment: NEGATIVE
Comment: NEGATIVE
Comment: NEGATIVE
Comment: NEGATIVE
Comment: NEGATIVE
Comment: NORMAL
Neisseria Gonorrhea: NEGATIVE
Trichomonas: NEGATIVE

## 2021-02-02 ENCOUNTER — Telehealth (HOSPITAL_COMMUNITY): Payer: Self-pay | Admitting: Emergency Medicine

## 2021-02-02 MED ORDER — METRONIDAZOLE 500 MG PO TABS: 500.0000 mg | ORAL_TABLET | Freq: Two times a day (BID) | ORAL | 0 refills | Status: DC

## 2021-02-22 IMAGING — US US MFM OB DETAIL+14 WK
1 series · 13 of 28 positions shown · non-contrast
Comparison: none

[Series 1: us mfm ob detail+14 wk · 51 acquisitions, 13 frames shown]
[im 2/51]
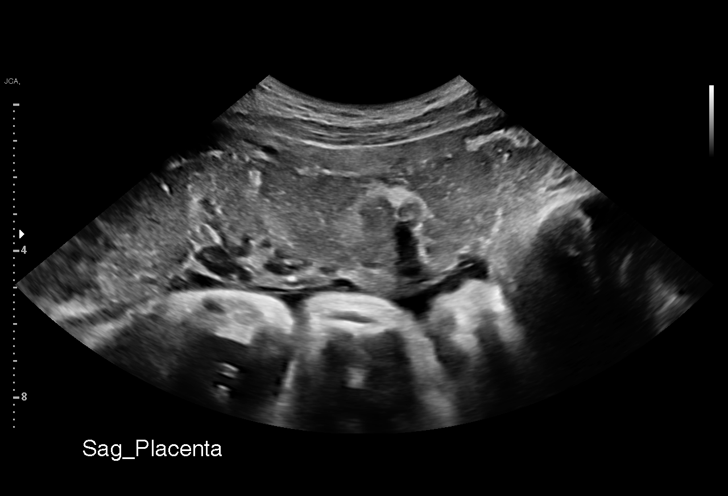
[im 6/51]
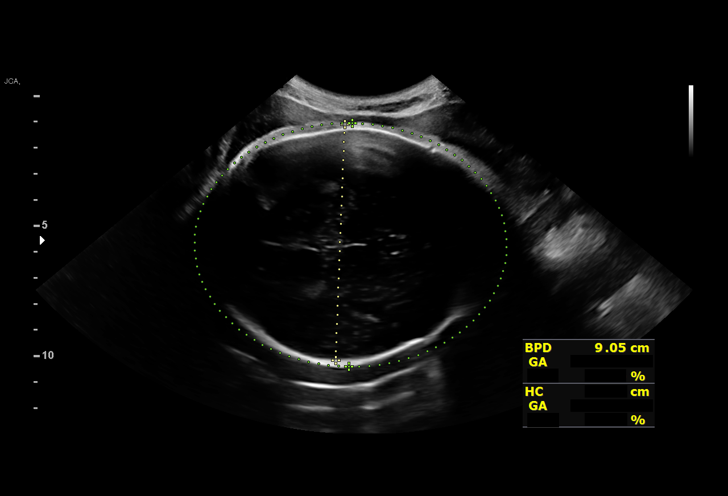
[im 10/51]
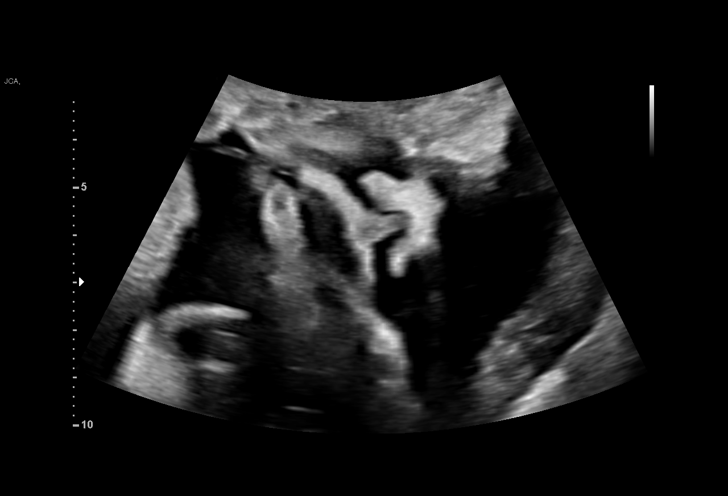
[im 13/51]
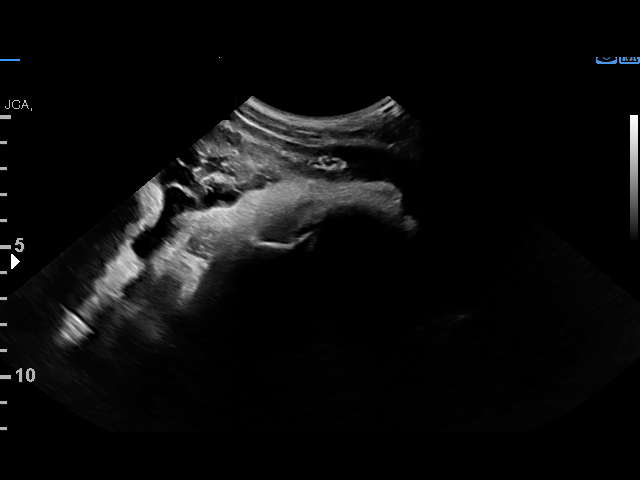
[im 17/51]
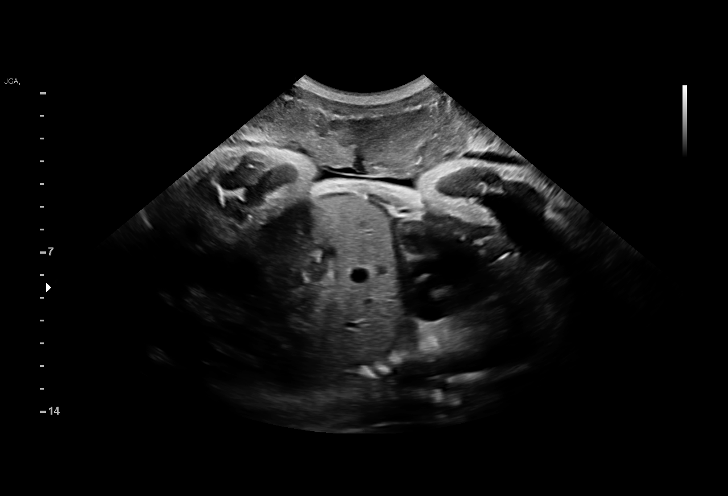
[im 21/51]
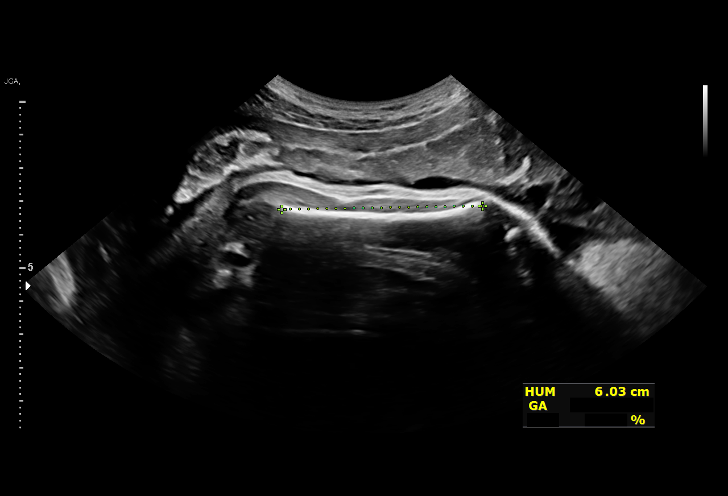
[im 26/51]
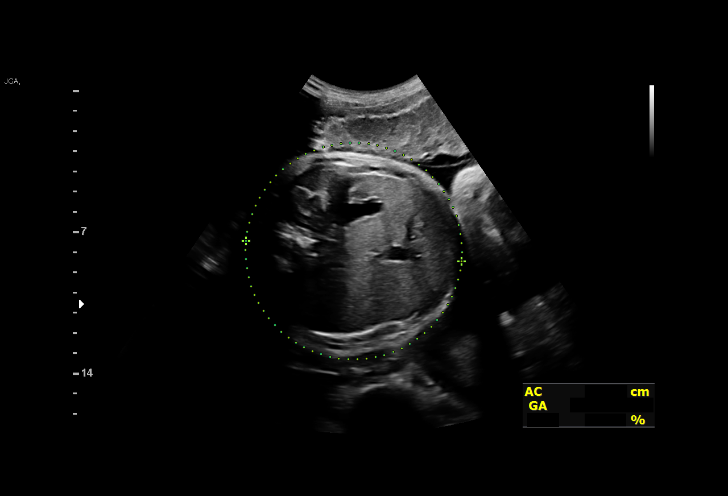
[im 30/51]
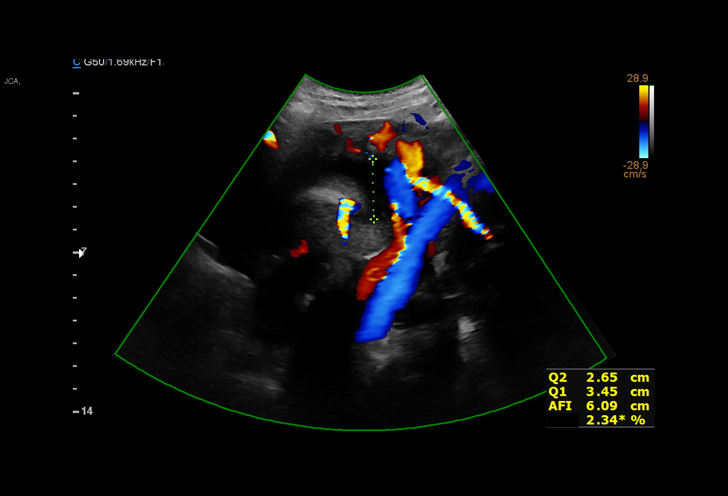
[im 34/51]
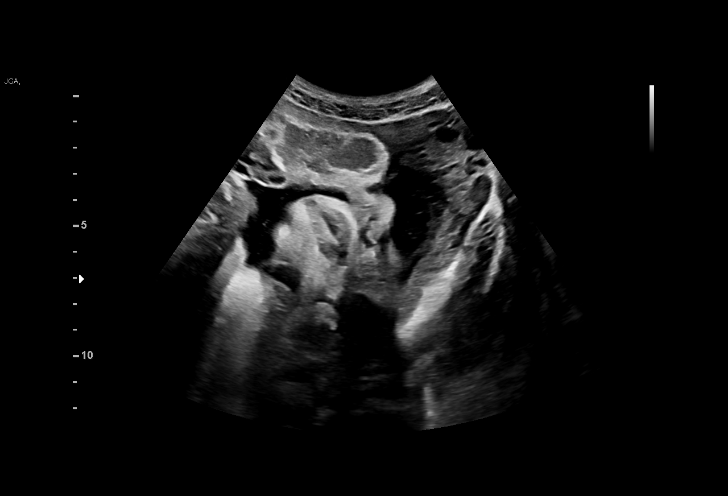
[im 38/51]
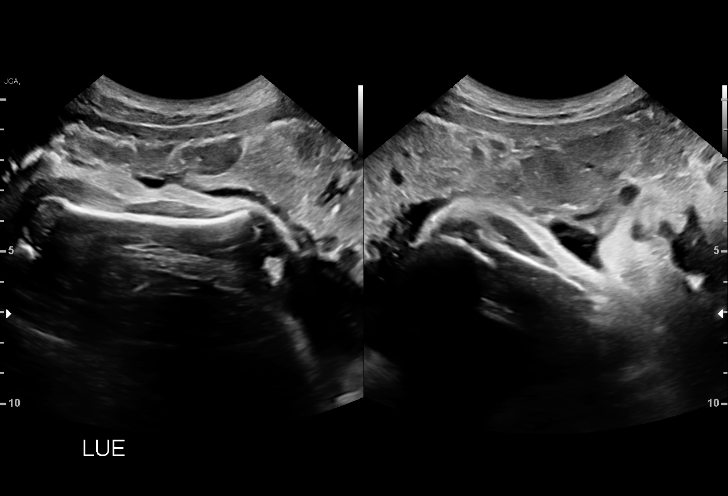
[im 41/51]
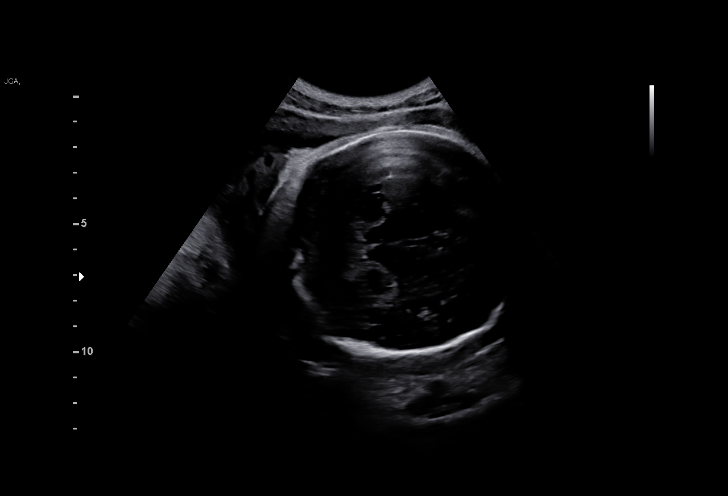
[im 45/51]
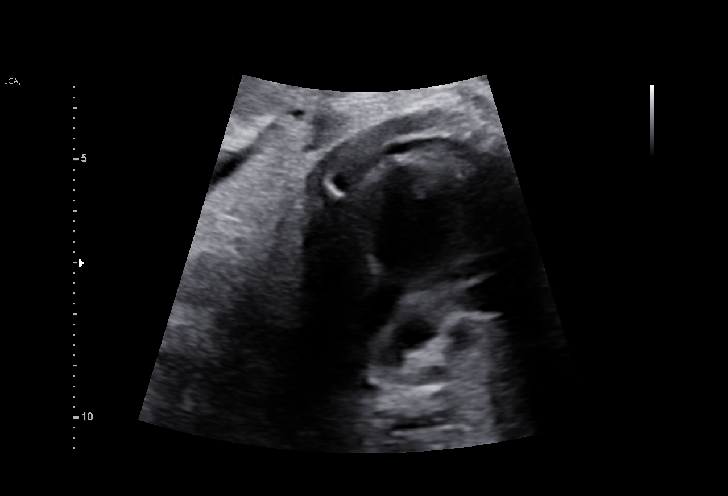
[im 49/51]
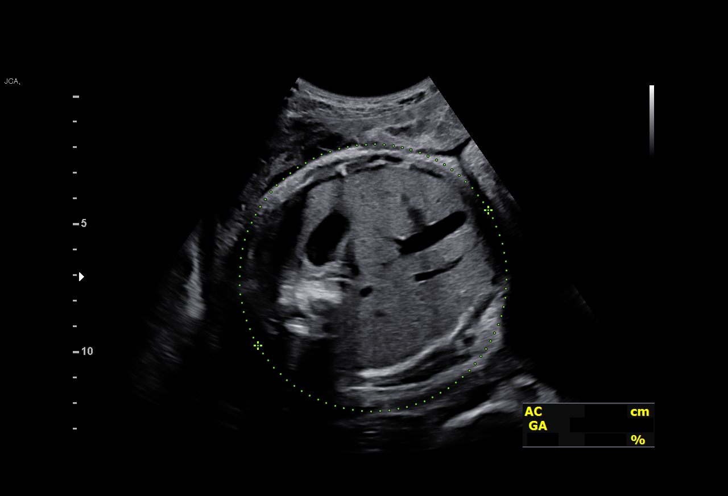

[13 of 28 positions shown; findings below may reference images not displayed]

[REDACTED]care

Indications

 Maternal care for known or suspected poor
 fetal growth, third trimester, not applicable or
 unspecified IUGR
 Encounter for antenatal screening for
 malformations
 37 weeks gestation of pregnancy
Fetal Evaluation

 Num Of Fetuses:         1
 Fetal Heart Rate(bpm):  147
 Cardiac Activity:       Observed
 Presentation:           Cephalic
 Placenta:               Anterior
 P. Cord Insertion:      Not well visualized

 Amniotic Fluid
 AFI FV:      Within normal limits

 AFI Sum(cm)     %Tile       Largest Pocket(cm)
 11.95           40

 RUQ(cm)       RLQ(cm)       LUQ(cm)        LLQ(cm)

Biometry
 BPD:      89.2  mm     G. Age:  36w 1d         24  %    CI:        71.94   %    70 - 86
                                                         FL/HC:      20.2   %    20.9 -
 HC:      334.7  mm     G. Age:  38w 2d         39  %    HC/AC:      0.99        0.92 -
 AC:      339.1  mm     G. Age:  37w 6d         66  %    FL/BPD:     75.7   %    71 - 87
 FL:       67.5  mm     G. Age:  34w 5d        1.7  %    FL/AC:      19.9   %    20 - 24
 HUM:      61.2  mm     G. Age:  35w 3d         31  %

 LV:        3.3  mm

 Est. FW:    8686  gm    6 lb 12 oz      37  %
OB History

 Gravidity:    4         Term:   2         SAB:   1
 Living:       2
Gestational Age

 Clinical EDD:  37w 6d                                        EDD:   03/28/20
 U/S Today:     36w 5d                                        EDD:   04/05/20
 Best:          37w 6d     Det. By:  Clinical EDD             EDD:   03/28/20
Anatomy

 Cranium:               Appears normal         Aortic Arch:            Not well visualized
 Cavum:                 Not well visualized    Ductal Arch:            Not well visualized
 Ventricles:            Appears normal         Diaphragm:              Appears normal
 Choroid Plexus:        Not well visualized    Stomach:                Appears normal, left
                                                                       sided
 Cerebellum:            Appears normal         Abdomen:                Appears normal
 Posterior Fossa:       Not well visualized    Abdominal Wall:         Not well visualized
 Nuchal Fold:           Not applicable (>20    Cord Vessels:           Not well visualized
                        wks GA)
 Face:                  Orbits nl; profile not Kidneys:                Not well visualized
                        well visualized
 Lips:                  Appears normal         Bladder:                Not well visualized
 Thoracic:              Appears normal         Spine:                  Not well visualized
 Heart:                 Not well visualized    Upper Extremities:      Visualized
 RVOT:                  Not well visualized    Lower Extremities:      Visualized
 LVOT:                  Not well visualized

 Other:  Technically difficult due to advanced gestational age. Technically
         difficult due to fetal position.
Comments

 This patient was seen for an ultrasound exam due to possible
 fetal growth restriction that was noted on ultrasounds
 performed at the [facility].  The patient denies any
 problems in her current pregnancy and denies any history of
 hypertension or diabetes.  She reports feeling vigorous fetal
 movements throughout the day.
 She was informed that the fetal growth and amniotic fluid
 level appears appropriate for her gestational age.
 The views of the fetal anatomy were limited today due to her
 advanced gestational age.
 Doppler studies of the umbilical arteries performed today
 continues to show normal forward flow.  There were no signs
 of absent or reversed end-diastolic flow noted today.
 As the fetal growth and amniotic fluid were within normal
 limits today, she was advised to continue routine prenatal
 care.
 Delivery may occur at 39 weeks or greater should there be
 any further concerns regarding the fetal growth.

## 2021-03-07 ENCOUNTER — Emergency Department (HOSPITAL_COMMUNITY)
Admission: EM | Admit: 2021-03-07 | Discharge: 2021-03-08 | Disposition: A | Discharge status: Home / Self Care | Payer: Medicaid Other | Attending: Emergency Medicine | Admitting: Emergency Medicine

## 2021-03-07 ENCOUNTER — Encounter (HOSPITAL_COMMUNITY): Payer: Self-pay | Admitting: Emergency Medicine

## 2021-03-07 ENCOUNTER — Other Ambulatory Visit: Payer: Self-pay

## 2021-03-07 DIAGNOSIS — M791 Myalgia, unspecified site: Secondary | ICD-10-CM | POA: Diagnosis not present

## 2021-03-07 DIAGNOSIS — Z5321 Procedure and treatment not carried out due to patient leaving prior to being seen by health care provider: Secondary | ICD-10-CM | POA: Insufficient documentation

## 2021-03-07 DIAGNOSIS — Z20822 Contact with and (suspected) exposure to covid-19: Secondary | ICD-10-CM | POA: Diagnosis not present

## 2021-03-07 LAB — RESP PANEL BY RT-PCR (FLU A&B, COVID) ARPGX2
Influenza A by PCR: NEGATIVE
Influenza B by PCR: NEGATIVE
SARS Coronavirus 2 by RT PCR: NEGATIVE

## 2021-03-07 NOTE — ED Triage Notes (Signed)
Pt c/o generalized body aches, and feeling "cold, but hot".

## 2021-03-08 NOTE — ED Notes (Signed)
Pt states "I just need a doctors note I already know I have the flu." I explained that in order to get a doctors note you would have to stay and see the provider. Pt states "im good bruh im leaving."

## 2021-03-23 ENCOUNTER — Emergency Department (HOSPITAL_COMMUNITY)
Admission: EM | Admit: 2021-03-23 | Discharge: 2021-03-24 | Disposition: A | Discharge status: Home / Self Care | Payer: Medicaid Other | Attending: Emergency Medicine | Admitting: Emergency Medicine

## 2021-03-23 ENCOUNTER — Encounter (HOSPITAL_COMMUNITY): Payer: Self-pay

## 2021-03-23 DIAGNOSIS — N390 Urinary tract infection, site not specified: Secondary | ICD-10-CM

## 2021-03-23 DIAGNOSIS — F17210 Nicotine dependence, cigarettes, uncomplicated: Secondary | ICD-10-CM | POA: Diagnosis not present

## 2021-03-23 DIAGNOSIS — R1030 Lower abdominal pain, unspecified: Secondary | ICD-10-CM | POA: Diagnosis present

## 2021-03-23 DIAGNOSIS — D72829 Elevated white blood cell count, unspecified: Secondary | ICD-10-CM | POA: Diagnosis not present

## 2021-03-23 DIAGNOSIS — N898 Other specified noninflammatory disorders of vagina: Secondary | ICD-10-CM | POA: Insufficient documentation

## 2021-03-23 NOTE — ED Provider Notes (Signed)
Emergency Medicine Provider Triage Evaluation Note  Jamie [name] , a 25 y.o. female  was evaluated in triage.  Pt complains of blood in her urine tonight  Review of Systems  Positive: Vaginal discharge Negative: No std risk,   Physical Exam  BP 120/75    Pulse 85    Temp 98.4 F (36.9 C) (Oral)    Resp 16    SpO2 100%  Gen:   Awake, no distress   Resp:  Normal effort  MSK:   Moves extremities without difficulty  Other:    Medical Decision Making  Medically screening exam initiated at 11:38 PM.  Appropriate orders placed.  Jamie [name] was informed that the remainder of the evaluation will be completed by another provider, this initial triage assessment does not replace that evaluation, and the importance of remaining in the ED until their evaluation is complete.     Elson Areas, PA-C 03/23/21 2358    Shon Baton, MD 03/25/21 [phone]

## 2021-03-23 NOTE — ED Triage Notes (Signed)
Pt states state that she been having lower abd pain and constipation for the past two days

## 2021-03-24 LAB — CBC WITH DIFFERENTIAL/PLATELET
Abs Immature Granulocytes: 0.02 10*3/uL (ref 0.00–0.07)
Basophils Absolute: 0 10*3/uL (ref 0.0–0.1)
Basophils Relative: 0 %
Eosinophils Absolute: 0 10*3/uL (ref 0.0–0.5)
Eosinophils Relative: 0 %
HCT: 41.4 % (ref 36.0–46.0)
Hemoglobin: 13.9 g/dL (ref 12.0–15.0)
Immature Granulocytes: 0 %
Lymphocytes Relative: 25 %
Lymphs Abs: 2.5 10*3/uL (ref 0.7–4.0)
MCH: 31.6 pg (ref 26.0–34.0)
MCHC: 33.6 g/dL (ref 30.0–36.0)
MCV: 94.1 fL (ref 80.0–100.0)
Monocytes Absolute: 0.9 10*3/uL (ref 0.1–1.0)
Monocytes Relative: 9 %
Neutro Abs: 6.4 10*3/uL (ref 1.7–7.7)
Neutrophils Relative %: 66 %
Platelets: 392 10*3/uL (ref 150–400)
RBC: 4.4 MIL/uL (ref 3.87–5.11)
RDW: 13.1 % (ref 11.5–15.5)
WBC: 9.8 10*3/uL (ref 4.0–10.5)
nRBC: 0 % (ref 0.0–0.2)

## 2021-03-24 LAB — URINALYSIS, ROUTINE W REFLEX MICROSCOPIC
Glucose, UA: NEGATIVE mg/dL
Ketones, ur: NEGATIVE mg/dL
Nitrite: POSITIVE — AB
Protein, ur: 100 mg/dL — AB
Specific Gravity, Urine: 1.025 (ref 1.005–1.030)
pH: 7.5 (ref 5.0–8.0)

## 2021-03-24 LAB — COMPREHENSIVE METABOLIC PANEL
ALT: 14 U/L (ref 0–44)
AST: 19 U/L (ref 15–41)
Albumin: 3.2 g/dL — ABNORMAL LOW (ref 3.5–5.0)
Alkaline Phosphatase: 55 U/L (ref 38–126)
Anion gap: 6 (ref 5–15)
BUN: 9 mg/dL (ref 6–20)
CO2: 28 mmol/L (ref 22–32)
Calcium: 9.2 mg/dL (ref 8.9–10.3)
Chloride: 105 mmol/L (ref 98–111)
Creatinine, Ser: 0.9 mg/dL (ref 0.44–1.00)
GFR, Estimated: >60 mL/min (ref >60)
Glucose, Bld: 86 mg/dL (ref 70–99)
Potassium: 3.7 mmol/L (ref 3.5–5.1)
Sodium: 139 mmol/L (ref 135–145)
Total Bilirubin: 0.5 mg/dL (ref 0.3–1.2)
Total Protein: 6.5 g/dL (ref 6.5–8.1)

## 2021-03-24 LAB — WET PREP, GENITAL
Clue Cells Wet Prep HPF POC: NONE SEEN
Sperm: NONE SEEN
Trich, Wet Prep: NONE SEEN
WBC, Wet Prep HPF POC: <10 (ref <10)
Yeast Wet Prep HPF POC: NONE SEEN

## 2021-03-24 LAB — URINALYSIS, MICROSCOPIC (REFLEX): RBC / HPF: >50 RBC/hpf (ref 0–5)

## 2021-03-24 LAB — LIPASE, BLOOD: Lipase: 90 U/L — ABNORMAL HIGH (ref 11–51)

## 2021-03-24 LAB — PREGNANCY, URINE: Preg Test, Ur: NEGATIVE

## 2021-03-24 MED ORDER — METRONIDAZOLE 500 MG PO TABS: 500.0000 mg | ORAL_TABLET | Freq: Two times a day (BID) | ORAL | 0 refills | Status: DC

## 2021-03-24 MED ORDER — CEPHALEXIN 500 MG PO CAPS: 500.0000 mg | ORAL_CAPSULE | Freq: Three times a day (TID) | ORAL | 0 refills | Status: AC

## 2021-03-24 NOTE — Discharge Instructions (Addendum)
You have a urinary tract infection, take Keflex 3 times daily for the next 7 days.  Extra you take all of the antibiotics even if your symptoms resolved.  We will also treat you for bacterial vaginosis for your vaginal discharge.  Take Flagyl twice daily for the next 7 days.  Do not drink alcohol while taking this medication.  You have STD testing pending and will be called by phone if any of these results are positive, you can also review results on MyChart.  If you have positive results he will need to notify any partners so that they can be treated as well.  If your results are positive you will need to follow-up at the health department or with your primary care doctor for treatment.  You should not be sexually active until you have received all results and completed any necessary treatment.

## 2021-03-24 NOTE — ED Provider Notes (Signed)
[facility] EMERGENCY DEPARTMENT Provider Note   CSN: [id number] Arrival date & time: 03/23/21  2325     History Chief Complaint  Patient presents with   Abdominal Pain    Jamie [name] is a 25 y.o. female.  Jamie [name] is a 25 y.o. female with hx of anxiety, who presents to the emergency department for evaluation of lower abdominal pain, dysuria and vaginal discharge.  Patient reports that she has been having discharge for a few weeks, she was seen in the ED about a month ago and diagnosed with BV reports that her prescriptions filled and she was never able to take it.  She reports that she has had some occasional light spotting but no heavy vaginal bleeding.  She also reports that she has been having some dysuria and urinary frequency associated with suprapubic abdominal pain.  No flank pain.  No fevers or chills.  No nausea or vomiting.  Also reports she has had some mild constipation, last bowel movement was 2 days ago.  No other aggravating or alleviating factors.  The history is provided by the patient.      Past Medical History:  Diagnosis Date   Breast lump     Patient Active Problem List   Diagnosis Date Noted   Atypical squamous cells of undetermined significance (ASCUS) on Papanicolaou smear of cervix 03/19/2020   Marijuana use 03/19/2020   Tobacco abuse 03/19/2020   Anxiety 03/19/2020   Vaginal delivery 03/19/2020   Supervision of high risk pregnancy, antepartum 02/14/2020   IUGR (intrauterine growth restriction) affecting care of mother 02/14/2020    History reviewed. No pertinent surgical history.   OB History     Gravida  4   Para  3   Term  3   Preterm  0   AB  1   Living  3      SAB  1   IAB  0   Ectopic  0   Multiple  0   Live Births  3           Family History  Problem Relation Age of Onset   Hypertension Mother    Hypertension Maternal Grandmother     Social  History   Tobacco Use   Smoking status: Every Day    Packs/day: 1.00    Types: Cigarettes   Smokeless tobacco: Never  Vaping Use   Vaping Use: Never used  Substance Use Topics   Alcohol use: Never   Drug use: Never    Home Medications Prior to Admission medications   Medication Sig Start Date End Date Taking? Authorizing Provider  cephALEXin (KEFLEX) 500 MG capsule Take 1 capsule (500 mg total) by mouth 3 (three) times daily for 7 days. 03/24/21 03/31/21 Yes Dartha Lodge, PA-C  metroNIDAZOLE (FLAGYL) 500 MG tablet Take 1 tablet (500 mg total) by mouth 2 (two) times daily. 03/24/21   Dartha Lodge, PA-C    Allergies    Patient has no known allergies.  Review of Systems   Review of Systems  Constitutional:  Negative for chills and fever.  HENT: Negative.    Respiratory:  Negative for cough and shortness of breath.   Cardiovascular:  Negative for chest pain.  Gastrointestinal:  Positive for abdominal pain and constipation. Negative for diarrhea, nausea and vomiting.  Genitourinary:  Positive for dysuria, frequency, vaginal bleeding and vaginal discharge.  Musculoskeletal:  Negative for arthralgias and myalgias.  All other systems reviewed  and are negative.  Physical Exam Updated Vital Signs BP 112/71 (BP Location: Right Arm)    Pulse 67    Temp 98.1 F (36.7 C) (Oral)    Resp 16    SpO2 99%   Physical Exam Vitals and nursing note reviewed.  Constitutional:      General: She is not in acute distress.    Appearance: Normal appearance. She is well-developed and normal weight. She is not ill-appearing or diaphoretic.  HENT:     Head: Normocephalic and atraumatic.  Eyes:     General:        Right eye: No discharge.        Left eye: No discharge.     Pupils: Pupils are equal, round, and reactive to light.  Cardiovascular:     Rate and Rhythm: Normal rate and regular rhythm.     Pulses: Normal pulses.     Heart sounds: Normal heart sounds.  Pulmonary:     Effort:  Pulmonary effort is normal. No respiratory distress.     Breath sounds: Normal breath sounds. No wheezing or rales.     Comments: Respirations equal and unlabored, patient able to speak in full sentences, lungs clear to auscultation bilaterally  Abdominal:     General: Bowel sounds are normal. There is no distension.     Palpations: Abdomen is soft. There is no mass.     Tenderness: There is abdominal tenderness in the suprapubic area. There is no guarding.     Comments: Abdomen soft, nondistended, mild suprapubic tenderness, all other quadrants nontender, no CVA tenderness bilaterally  Genitourinary:    Comments: Chaperone present during pelvic exam. No external genital lesions noted. On speculum exam patient has a moderate amount of thin white discharge present, no bleeding, cervix appears normal On bimanual exam patient with no cervical motion tenderness, no adnexal tenderness or palpable masses. Musculoskeletal:        General: No deformity.     Cervical back: Neck supple.  Skin:    General: Skin is warm and dry.     Capillary Refill: Capillary refill takes less than 2 seconds.  Neurological:     Mental Status: She is alert and oriented to person, place, and time.     Coordination: Coordination normal.     Comments: Speech is clear, able to follow commands Moves extremities without ataxia, coordination intact  Psychiatric:        Mood and Affect: Mood normal.        Behavior: Behavior normal.    ED Results / Procedures / Treatments   Labs (all labs ordered are listed, but only abnormal results are displayed) Labs Reviewed  URINALYSIS, ROUTINE W REFLEX MICROSCOPIC - Abnormal; Notable for the following components:      Result Value   Color, Urine AMBER (*)    APPearance CLOUDY (*)    Hgb urine dipstick LARGE (*)    Bilirubin Urine SMALL (*)    Protein, ur 100 (*)    Nitrite POSITIVE (*)    Leukocytes,Ua MODERATE (*)    All other components within normal limits   URINALYSIS, MICROSCOPIC (REFLEX) - Abnormal; Notable for the following components:   Bacteria, UA FEW (*)    All other components within normal limits  COMPREHENSIVE METABOLIC PANEL - Abnormal; Notable for the following components:   Albumin 3.2 (*)    All other components within normal limits  LIPASE, BLOOD - Abnormal; Notable for the following components:   Lipase 90 (*)  All other components within normal limits  WET PREP, GENITAL  URINE CULTURE  PREGNANCY, URINE  CBC WITH DIFFERENTIAL/PLATELET  GC/CHLAMYDIA PROBE AMP ([place]) NOT AT [facility]    EKG None  Radiology No results found.  Procedures Procedures   Medications Ordered in ED Medications - No data to display  ED Course  I have reviewed the triage vital signs and the nursing notes.  Pertinent labs & imaging results that were available during my care of the patient were reviewed by me and considered in my medical decision making (see chart for details).    MDM Rules/Calculators/A&P                          Patient presents to the ED with complaints of abdominal pain. Patient nontoxic appearing, in no apparent distress, vitals WNL. On exam patient tender to to palpation primarily in the suprapubic region, no peritoneal signs.  On pelvic exam patient with no concern for PID, no adnexal masses, has a moderate amount of thin white discharge present.  Will evaluate with labs, do not feel that imaging is indicated at this time.  Additional history obtained:  Additional history obtained from chart review & nursing note review.   Lab Tests:  I Ordered, reviewed, and interpreted labs, which included:  CBC: No leukocytosis, normal hemoglobin CMP: No significant electrolyte derangements, normal renal and liver function Lipase: Mildly elevated at 90 UA: Concerning for infection with positive nitrites, moderate leukocytes, 21-50 WBCs and few bacteria present Wet prep: No clue cells seen, but patient reports symptoms  are consistent with prior BV that she never completely treated, no other abnormal findings Preg test: Negative  GC/chlamydia cultures pending  ED Course:   Work-up is consistent with UTI, while wet prep today is negative patient has a moderate amount of discharge present and never completely treated BV previously we will represcribe Flagyl.  Keflex prescribed for UTI.  On repeat abdominal exam patient remains without peritoneal signs, low suspicion for cholecystitis, pancreatitis, diverticulitis, appendicitis, bowel obstruction/perforation,  PID, ectopic pregnancy, or other acute surgical process. Patient tolerating PO in the emergency department. Will discharge home with supportive measures. I discussed results, treatment plan, need for PCP follow-up, and return precautions with the patient.  Patient is aware she has STD testing pending and will need to follow-up for treatment of any positive results and notify any partners.  Provided opportunity for questions, patient confirmed understanding and is in agreement with plan.    Portions of this note were generated with Scientist, clinical (histocompatibility and immunogenetics). Dictation errors may occur despite best attempts at proofreading.   Final Clinical Impression(s) / ED Diagnoses Final diagnoses:  Acute UTI  Vaginal discharge  Lower abdominal pain    Rx / DC Orders ED Discharge Orders          Ordered    cephALEXin (KEFLEX) 500 MG capsule  3 times daily        03/24/21 0621    metroNIDAZOLE (FLAGYL) 500 MG tablet  2 times daily        03/24/21 0254             Dartha Lodge, PA-C 03/24/21 2706    Tilden Fossa, MD 03/24/21 [phone]

## 2021-03-25 LAB — GC/CHLAMYDIA PROBE AMP (~~LOC~~) NOT AT ARMC
Chlamydia: NEGATIVE
Comment: NEGATIVE
Comment: NORMAL
Neisseria Gonorrhea: NEGATIVE

## 2021-03-26 LAB — URINE CULTURE: Culture: >=100000 — AB

## 2021-03-27 ENCOUNTER — Telehealth (HOSPITAL_BASED_OUTPATIENT_CLINIC_OR_DEPARTMENT_OTHER): Payer: Self-pay | Admitting: *Deleted

## 2021-03-27 NOTE — Progress Notes (Signed)
ED Antimicrobial Stewardship Positive Culture Follow Up   Jamie [name] Jamie [name] is an 25 y.o. female who presented to [facility] on 03/23/2021 with a chief complaint of  Chief Complaint  Patient presents with   Abdominal Pain    Recent Results (from the past 720 hour(s))  Resp Panel by RT-PCR (Flu A&B, Covid) Nasopharyngeal Swab     Status: None   Collection Time: 03/07/21 10:01 PM   Specimen: Nasopharyngeal Swab; Nasopharyngeal(NP) swabs in vial transport medium  Result Value Ref Range Status   SARS Coronavirus 2 by RT PCR NEGATIVE NEGATIVE Final    Comment: (NOTE) SARS-CoV-2 target nucleic acids are NOT DETECTED.  The SARS-CoV-2 RNA is generally detectable in upper respiratory specimens during the acute phase of infection. The lowest concentration of SARS-CoV-2 viral copies this assay can detect is 138 copies/mL. A negative result does not preclude SARS-Cov-2 infection and should not be used as the sole basis for treatment or other patient management decisions. A negative result may occur with  improper specimen collection/handling, submission of specimen other than nasopharyngeal swab, presence of viral mutation(s) within the areas targeted by this assay, and inadequate number of viral copies(<138 copies/mL). A negative result must be combined with clinical observations, patient history, and epidemiological information. The expected result is Negative.  Fact Sheet for Patients:  BloggerCourse.com  Fact Sheet for Healthcare Providers:  SeriousBroker.it  This test is no t yet approved or cleared by the [place] FDA and  has been authorized for detection and/or diagnosis of SARS-CoV-2 by FDA under an Emergency Use Authorization (EUA). This EUA will remain  in effect (meaning this test can be used) for the duration of the COVID-19 declaration under Section 564(b)(1) of the Act, 21 U.S.C.section 360bbb-3(b)(1),  unless the authorization is terminated  or revoked sooner.       Influenza A by PCR NEGATIVE NEGATIVE Final   Influenza B by PCR NEGATIVE NEGATIVE Final    Comment: (NOTE) The Xpert Xpress SARS-CoV-2/FLU/RSV plus assay is intended as an aid in the diagnosis of influenza from Nasopharyngeal swab specimens and should not be used as a sole basis for treatment. Nasal washings and aspirates are unacceptable for Xpert Xpress SARS-CoV-2/FLU/RSV testing.  Fact Sheet for Patients: BloggerCourse.com  Fact Sheet for Healthcare Providers: SeriousBroker.it  This test is not yet approved or cleared by the [place] FDA and has been authorized for detection and/or diagnosis of SARS-CoV-2 by FDA under an Emergency Use Authorization (EUA). This EUA will remain in effect (meaning this test can be used) for the duration of the COVID-19 declaration under Section 564(b)(1) of the Act, 21 U.S.C. section 360bbb-3(b)(1), unless the authorization is terminated or revoked.  Performed at [facility], [address] [place], [place] [address]   Wet prep, genital     Status: None   Collection Time: 03/24/21  4:42 AM  Result Value Ref Range Status   Yeast Wet Prep HPF POC NONE SEEN NONE SEEN Final   Trich, Wet Prep NONE SEEN NONE SEEN Final   Clue Cells Wet Prep HPF POC NONE SEEN NONE SEEN Final   WBC, Wet Prep HPF POC <10 <10 Final   Sperm NONE SEEN  Final    Comment: Performed at [facility], [address], [place], [place] [address]  Urine Culture     Status: Abnormal   Collection Time: 03/24/21  5:29 PM   Specimen: Urine, Clean Catch  Result Value Ref Range Status  Specimen Description URINE, CLEAN CATCH  Final   Special Requests   Final    NONE Performed at [facility], [address] [place], [place] [address]    Culture >=100,000 COLONIES/mL STAPHYLOCOCCUS SAPROPHYTICUS (A)  Final   Report Status 03/26/2021 FINAL   Final   Organism ID, Bacteria STAPHYLOCOCCUS SAPROPHYTICUS (A)  Final      Susceptibility   Staphylococcus saprophyticus - MIC*    CIPROFLOXACIN <=0.5 SENSITIVE Sensitive     GENTAMICIN <=0.5 SENSITIVE Sensitive     NITROFURANTOIN <=16 SENSITIVE Sensitive     OXACILLIN 2 RESISTANT Resistant     TETRACYCLINE >=16 RESISTANT Resistant     VANCOMYCIN 1 SENSITIVE Sensitive     TRIMETH/SULFA 80 RESISTANT Resistant     CLINDAMYCIN <=0.25 SENSITIVE Sensitive     RIFAMPIN <=0.5 SENSITIVE Sensitive     Inducible Clindamycin NEGATIVE Sensitive     * >=100,000 COLONIES/mL STAPHYLOCOCCUS SAPROPHYTICUS    [x]  Treated with Keflex, organism resistant to prescribed antimicrobial  New antibiotic prescription: Macrobid  ED Provider: , PA-C   Claudette Stapler 03/27/2021, 2:46 PM Clinical Pharmacist Monday - Friday phone -  [phone] Saturday - Sunday phone - [phone]

## 2021-03-27 NOTE — Telephone Encounter (Signed)
Post ED Visit - Positive Culture Follow-up: Unsuccessful Patient Follow-up  Culture assessed and recommendations reviewed by:  []  , Pharm.D. []  Enzo Bi, Pharm.D., BCPS AQ-ID []  , Pharm.D., BCPS []  Celedonio Miyamoto, .D., BCPS []  [place], .D., BCPS, AAHIVP []  Georgina Pillion, Pharm.D., BCPS, AAHIVP []  [address], PharmD [x]  , PharmD, BCPS  Positive urine culture  []  Patient discharged without antimicrobial prescription and treatment is now indicated [x]  Organism is resistant to prescribed ED discharge antimicrobial []  Patient with positive blood cultures   Unable to contact patient , letter will be sent to address on file  Plan:continue flagyl. D/C Keflex. Start Maccrobid 100mg  TID x 5 days per [place], PA-C  [place] 03/27/2021, 11:34 AM

## 2021-04-19 ENCOUNTER — Encounter (HOSPITAL_COMMUNITY): Payer: Self-pay | Admitting: Obstetrics & Gynecology

## 2021-04-19 ENCOUNTER — Inpatient Hospital Stay (HOSPITAL_COMMUNITY)
Admission: AD | Admit: 2021-04-19 | Discharge: 2021-04-19 | Disposition: A | Discharge status: Home / Self Care | Payer: Medicaid Other | Attending: Obstetrics & Gynecology | Admitting: Obstetrics & Gynecology

## 2021-04-19 ENCOUNTER — Inpatient Hospital Stay (HOSPITAL_COMMUNITY): Payer: Medicaid Other

## 2021-04-19 ENCOUNTER — Other Ambulatory Visit: Payer: Self-pay

## 2021-04-19 DIAGNOSIS — Z3A01 Less than 8 weeks gestation of pregnancy: Secondary | ICD-10-CM | POA: Diagnosis not present

## 2021-04-19 DIAGNOSIS — O209 Hemorrhage in early pregnancy, unspecified: Secondary | ICD-10-CM | POA: Diagnosis not present

## 2021-04-19 DIAGNOSIS — R102 Pelvic and perineal pain: Secondary | ICD-10-CM | POA: Diagnosis not present

## 2021-04-19 DIAGNOSIS — O3680X0 Pregnancy with inconclusive fetal viability, not applicable or unspecified: Secondary | ICD-10-CM | POA: Diagnosis not present

## 2021-04-19 DIAGNOSIS — O26899 Other specified pregnancy related conditions, unspecified trimester: Secondary | ICD-10-CM

## 2021-04-19 DIAGNOSIS — R109 Unspecified abdominal pain: Secondary | ICD-10-CM

## 2021-04-19 DIAGNOSIS — N930 Postcoital and contact bleeding: Secondary | ICD-10-CM | POA: Insufficient documentation

## 2021-04-19 DIAGNOSIS — O26891 Other specified pregnancy related conditions, first trimester: Secondary | ICD-10-CM | POA: Insufficient documentation

## 2021-04-19 LAB — CBC
HCT: 38.8 % (ref 36.0–46.0)
Hemoglobin: 13.2 g/dL (ref 12.0–15.0)
MCH: 32 pg (ref 26.0–34.0)
MCHC: 34 g/dL (ref 30.0–36.0)
MCV: 93.9 fL (ref 80.0–100.0)
Platelets: 249 10*3/uL (ref 150–400)
RBC: 4.13 MIL/uL (ref 3.87–5.11)
RDW: 13.2 % (ref 11.5–15.5)
WBC: 5.7 10*3/uL (ref 4.0–10.5)
nRBC: 0 % (ref 0.0–0.2)

## 2021-04-19 LAB — HCG, QUANTITATIVE, PREGNANCY: hCG, Beta Chain, Quant, S: 711 m[IU]/mL — ABNORMAL HIGH (ref <5)

## 2021-04-19 LAB — POC URINE PREG, ED: Preg Test, Ur: POSITIVE — AB

## 2021-04-19 NOTE — MAU Provider Note (Signed)
Chief Complaint: Vaginal Bleeding   Seen by provider at 0350     SUBJECTIVE HPI: Jamie [name] is a 26 y.o. [id number] at [redacted]w[redacted]d by LMP who presents to maternity admissions reporting spotting after intercourse and some lower abdominal cramps (now resolved).. She denies vaginal itching/burning, urinary symptoms, h/a, dizziness, n/v, or fever/chills.    Vaginal Bleeding The patient's primary symptoms include pelvic pain and vaginal bleeding. The patient's pertinent negatives include no genital itching, genital lesions or genital odor. This is a new problem. The current episode started today. The problem has been resolved. The pain is mild. She is pregnant. Pertinent negatives include no abdominal pain, chills, constipation, diarrhea, dysuria, fever, frequency, nausea or vomiting. The vaginal discharge was bloody. The vaginal bleeding is spotting. She has not been passing clots. She has not been passing tissue. Nothing aggravates the symptoms. She has tried nothing for the symptoms.  RN note: PT SAYS SHE WENT TO [place] FOR VB HAS OCC SPOTTING AFTER SEX. LAST SEX - BEFORE CAME  LOWER ABD CRAMPS- NO MEDS  Past Medical History:  Diagnosis Date   Breast lump    No past surgical history on file. Social History   Socioeconomic History   Marital status: Single    Spouse name: Not on file   Number of children: Not on file   Years of education: Not on file   Highest education level: Not on file  Occupational History   Not on file  Tobacco Use   Smoking status: Every Day    Packs/day: 1.00    Types: Cigarettes   Smokeless tobacco: Never  Vaping Use   Vaping Use: Never used  Substance and Sexual Activity   Alcohol use: Never   Drug use: Never   Sexual activity: Not on file  Other Topics Concern   Not on file  Social History Narrative   Not on file   Social Determinants of Health   Financial Resource Strain: Not on file  Food Insecurity: Not on file  Transportation  Needs: Not on file  Physical Activity: Not on file  Stress: Not on file  Social Connections: Not on file  Intimate Partner Violence: Not on file   No current facility-administered medications on file prior to encounter.   Current Outpatient Medications on File Prior to Encounter  Medication Sig Dispense Refill   metroNIDAZOLE (FLAGYL) 500 MG tablet Take 1 tablet (500 mg total) by mouth 2 (two) times daily. 14 tablet 0   No Known Allergies  I have reviewed patient's Past Medical Hx, Surgical Hx, Family Hx, Social Hx, medications and allergies.   ROS:  Review of Systems  Constitutional:  Negative for chills and fever.  Gastrointestinal:  Negative for abdominal pain, constipation, diarrhea, nausea and vomiting.  Genitourinary:  Positive for pelvic pain and vaginal bleeding. Negative for dysuria and frequency.  Review of Systems  Other systems negative   Physical Exam  Physical Exam Patient Vitals for the past 24 hrs:  BP Temp Temp src Pulse Resp SpO2 Height Weight  04/19/21 0342 (!) 107/51 98.6 F (37 C) Oral 88 20 -- 5\' 9"  (1.753 m) 57.9 kg  04/19/21 0158 111/63 98.6 F (37 C) Oral 100 14 100 % -- --   Constitutional: Well-developed, well-nourished female in no acute distress.  Cardiovascular: normal rate Respiratory: normal effort GI: Abd soft, non-tender.  MS: Extremities nontender, no edema, normal ROM Neurologic: Alert and oriented x 4.  GU: Neg CVAT.  PELVIC EXAM: deferred since  bleeding has stopped and has no pain now.   LAB RESULTS Results for orders placed or performed during the hospital encounter of 04/19/21 (from the past 24 hour(s))  POC Urine Pregnancy, ED (not at [facility])     Status: Abnormal   Collection Time: 04/19/21  2:55 AM  Result Value Ref Range   Preg Test, Ur POSITIVE (A) NEGATIVE  CBC     Status: None   Collection Time: 04/19/21  3:37 AM  Result Value Ref Range   WBC 5.7 4.0 - 10.5 K/uL   RBC 4.13 3.87 - 5.11 MIL/uL   Hemoglobin 13.2 12.0 -  15.0 g/dL   HCT 38.8 36.0 - 46.0 %   MCV 93.9 80.0 - 100.0 fL   MCH 32.0 26.0 - 34.0 pg   MCHC 34.0 30.0 - 36.0 g/dL   RDW 13.2 11.5 - 15.5 %   Platelets 249 150 - 400 K/uL   nRBC 0.0 0.0 - 0.2 %  hCG, quantitative, pregnancy     Status: Abnormal   Collection Time: 04/19/21  3:37 AM  Result Value Ref Range   hCG, Beta Chain, Quant, S 711 (H) <5 mIU/mL     IMAGING US OB LESS THAN 14 WEEKS WITH OB TRANSVAGINAL  Result Date: 04/19/2021 CLINICAL DATA:  Initial evaluation for acute vaginal bleeding, early pregnancy. EXAM: OBSTETRIC <14 WK [place] AND TRANSVAGINAL OB US TECHNIQUE: Both transabdominal and transvaginal ultrasound examinations were performed for complete evaluation of the gestation as well as the maternal uterus, adnexal regions, and pelvic cul-de-sac. Transvaginal technique was performed to assess early pregnancy. COMPARISON:  None available. FINDINGS: Intrauterine gestational sac: Possible tiny early gestational sac (image 51). Yolk sac:  Negative. Embryo:  Negative. Cardiac Activity: Negative. Heart Rate: N/A MSD: 2.1 mm   4 w   6 d Subchorionic hemorrhage:  None visualized. Maternal uterus/adnexae: Ovaries within normal limits bilaterally. Small degenerating corpus luteal cyst noted within the left ovary. No adnexal mass or free fluid. IMPRESSION: 1. Possible early intrauterine gestational sac, but no yolk sac, fetal pole, or cardiac activity yet visualized. Recommend follow-up quantitative B-HCG levels and follow-up US in 14 days to assess viability. This recommendation follows SRU consensus guidelines: Diagnostic Criteria for Nonviable Pregnancy Early in the First Trimester. [place] Med [id number]. 2. No other acute maternal uterine or adnexal abnormality. Electronically Signed   By: Jeannine Boga M.D.   On: 04/19/2021 04:42    MAU Management/MDM: Ordered usual first trimester r/o ectopic labs.   Pelvic cultures done Will check baseline Ultrasound to rule out  ectopic.  This bleeding/pain can represent a normal pregnancy with bleeding, spontaneous abortion or even an ectopic which can be life-threatening.  The process as listed above helps to determine which of these is present.  Reviewed findings Discussed until we see a yolk sac inside gestational sac, we cannot rule out ectopic.   Recommend repeat HCG on Sunday morning.   If it doubles, plan repeat US in 7-10 days.  ASSESSMENT 1. Bleeding in early pregnancy   2. Pregnancy of unknown anatomic location   3.     Pelvic cramping in early pregnancy  PLAN Discharge home Plan to repeat HCG level in 48 hours in MAU on Sunday. Will repeat  Ultrasound in about 7-10 days if HCG levels double appropriately  Ectopic precautions  Pt stable at time of discharge. Encouraged to return here if she develops worsening of symptoms, increase in pain, fever, or other concerning symptoms.  Hansel Feinstein CNM, MSN Certified Nurse-Midwife 04/19/2021  5:19 AM

## 2021-04-19 NOTE — ED Provider Notes (Signed)
Emergency Medicine Provider OB Triage Evaluation Note  Jamie [name] is a 26 y.o. female, [phone], at Unknown gestation who presents to the emergency department with complaints of vaginal bleeding and lower abd cramping. Pt states she took 3 preg test at home, all are positive. Per her app, she is ~[redacted] wks pregnant. Bleeding started yesterday, and has increased today. She has had 1 miscarriage previously.   Review of  Systems  Positive: vaginal bleeding, lower abd cramping Negative: fever, n/v, vaginal discharge  Physical Exam  BP 111/63 (BP Location: Right Arm)    Pulse 100    Temp 98.6 F (37 C) (Oral)    Resp 14    SpO2 100%  General: Awake, no distress  HEENT: Atraumatic  Resp: Normal effort  Cardiac: Normal rate Abd: Nondistended, nontender  MSK: Moves all extremities without difficulty Neuro: Speech clear  Medical Decision Making  Pt evaluated for pregnancy concern and is stable for transfer to MAU. Pt is in agreement with plan for transfer.  2:37 AM Discussed with MAU APP, Hilda Lias, who accepts patient in transfer.  Clinical Impression  No diagnosis found.     Alveria Apley, PA-C 04/19/21 1914    [name], MD 04/19/21 [id number]

## 2021-04-19 NOTE — ED Triage Notes (Signed)
Pt reported to ED with c/o vaginal bleeding and lower abdominal cramping since today. States LMP was 03/11/2021. Reports three full term pregnancies and one miscarriage.

## 2021-04-19 NOTE — MAU Note (Signed)
PT SAYS SHE WENT TO [place] FOR VB HAS OCC SPOTTING AFTER SEX. LAST SEX - BEFORE CAME  LOWER ABD CRAMPS- NO MEDS

## 2021-04-21 ENCOUNTER — Inpatient Hospital Stay (HOSPITAL_COMMUNITY)
Admission: AD | Admit: 2021-04-21 | Discharge: 2021-04-21 | Discharge status: Against Medical Advice | Payer: Medicaid Other | Attending: Obstetrics & Gynecology | Admitting: Obstetrics & Gynecology

## 2021-04-21 ENCOUNTER — Other Ambulatory Visit (HOSPITAL_COMMUNITY): Admit: 2021-04-21 | Payer: Medicaid Other

## 2021-04-21 ENCOUNTER — Encounter (HOSPITAL_COMMUNITY): Payer: Self-pay

## 2021-04-21 DIAGNOSIS — O209 Hemorrhage in early pregnancy, unspecified: Secondary | ICD-10-CM

## 2021-04-21 DIAGNOSIS — Z3A01 Less than 8 weeks gestation of pregnancy: Secondary | ICD-10-CM | POA: Diagnosis not present

## 2021-04-21 DIAGNOSIS — Z6710 Type A blood, Rh positive: Secondary | ICD-10-CM | POA: Insufficient documentation

## 2021-04-21 DIAGNOSIS — O4690 Antepartum hemorrhage, unspecified, unspecified trimester: Secondary | ICD-10-CM | POA: Insufficient documentation

## 2021-04-21 DIAGNOSIS — Z5321 Procedure and treatment not carried out due to patient leaving prior to being seen by health care provider: Secondary | ICD-10-CM | POA: Insufficient documentation

## 2021-04-21 DIAGNOSIS — Z3A00 Weeks of gestation of pregnancy not specified: Secondary | ICD-10-CM | POA: Insufficient documentation

## 2021-04-21 LAB — HCG, QUANTITATIVE, PREGNANCY: hCG, Beta Chain, Quant, S: 990 m[IU]/mL — ABNORMAL HIGH (ref <5)

## 2021-04-21 NOTE — MAU Provider Note (Signed)
Event Date/Time   First Provider Initiated Contact with Patient 04/21/21 1749      S [name] is a 26 y.o. [id number] patient who presents to MAU today for repeat Quant hCG following MAU evaluation on 04/19/2021. Patient reports one episode of heavy vaginal bleeding this morning around 0400 when she finished her work shift. She saturated her pants then her bleeding resolved. She denies pain, weakness, syncope.  O BP 112/65 (BP Location: Right Arm)    Pulse 94    Temp 98.6 F (37 C) (Oral)    Resp 16    Ht 5\' 9"  (1.753 m)    Wt 57.5 kg    LMP 03/11/2021    SpO2 99%    BMI 18.71 kg/m   Physical Exam Vitals and nursing note reviewed. Exam conducted with a chaperone present.  Constitutional:      Appearance: Normal appearance. She is not ill-appearing.  Cardiovascular:     Rate and Rhythm: Normal rate.     Pulses: Normal pulses.  Pulmonary:     Effort: Pulmonary effort is normal.  Abdominal:     General: Abdomen is flat.  Neurological:     Mental Status: She is alert.  Psychiatric:        Mood and Affect: Mood normal.        Behavior: Behavior normal.        Thought Content: Thought content normal.        Judgment: Judgment normal.    A Medical screening exam complete "Possible" intrauterine gestational sac on previous imaging from 04/19/2021 Episode of heavy vaginal bleeding early this morning (blood type A POS) Inappropriate change in quant hCG Dr. Harolyn Rutherford in agreement with my plan to repeat quant in office in 48 hours  Component     Latest Ref Rng & Units 04/19/2021 04/21/2021  HCG, Beta Chain, Quant, S     <5 mIU/mL 711 (H) 990 (H)    P Patient not in lobby when called by CNM to discuss results Patient called at home at [place], HIPAA-compliant voicemail left requesting call back to review results  F/U: Appt made for repeat stat Quant hCG at [facility] 01/18 at [place], [place] 04/21/2021 7:43 PM

## 2021-04-21 NOTE — MAU Note (Signed)
Here for repeat blood work.  Still having some spotting.  Bled through her clothes last night, had one dot today when she got up around 4. No pain.

## 2021-04-24 ENCOUNTER — Telehealth: Payer: Self-pay | Admitting: *Deleted

## 2021-04-24 ENCOUNTER — Ambulatory Visit: Payer: Medicaid Other

## 2021-04-24 NOTE — Telephone Encounter (Signed)
No show for STAT bHCG. TC to check on patient. No answer. Left HIPPA compliant VM with call back number.

## 2021-08-01 ENCOUNTER — Ambulatory Visit
Admission: EM | Admit: 2021-08-01 | Discharge: 2021-08-01 | Disposition: A | Discharge status: Home / Self Care | Payer: Medicaid Other | Attending: Internal Medicine | Admitting: Internal Medicine

## 2021-08-01 DIAGNOSIS — N898 Other specified noninflammatory disorders of vagina: Secondary | ICD-10-CM | POA: Diagnosis present

## 2021-08-01 DIAGNOSIS — Z113 Encounter for screening for infections with a predominantly sexual mode of transmission: Secondary | ICD-10-CM | POA: Diagnosis present

## 2021-08-01 DIAGNOSIS — N3000 Acute cystitis without hematuria: Secondary | ICD-10-CM | POA: Insufficient documentation

## 2021-08-01 LAB — POCT URINALYSIS DIP (MANUAL ENTRY)
Bilirubin, UA: NEGATIVE
Blood, UA: NEGATIVE
Glucose, UA: NEGATIVE mg/dL
Ketones, POC UA: NEGATIVE mg/dL
Nitrite, UA: NEGATIVE
Protein Ur, POC: NEGATIVE mg/dL
Spec Grav, UA: 1.015 (ref 1.010–1.025)
Urobilinogen, UA: 0.2 E.U./dL
pH, UA: 8 — AB (ref 5.0–8.0)

## 2021-08-01 LAB — POCT URINE PREGNANCY: Preg Test, Ur: NEGATIVE

## 2021-08-01 MED ORDER — NITROFURANTOIN MONOHYD MACRO 100 MG PO CAPS: 100.0000 mg | ORAL_CAPSULE | Freq: Two times a day (BID) | ORAL | 0 refills | Status: DC

## 2021-08-01 NOTE — ED Triage Notes (Addendum)
Patient presents to Urgent Care with complaints of vaginal  white discharge, itching, odor since 3/20.pt also reports abd pain, low back pain dysuria and oliguria. Patient reports some new sexual partners as well.  ? ?

## 2021-08-01 NOTE — ED Provider Notes (Signed)
?[facility] ? ? ? ?CSN: [id number] ?Arrival date & time: 08/01/21  1708 ? ? ?  ? ?History   ?Chief Complaint ?Chief Complaint  ?Patient presents with  ? Vaginal Itching  ? ? ?HPI ?Jamie [name] is a 26 y.o. female.  ? ?Patient presents with vaginal discharge, vaginal odor, urinary burning, lower abdominal pain, back pain that has been present for approximately 1 month.  Patient denies any known exposure to STD but reports that she does have recent sexual partners and has been having unprotected sexual intercourse.  Last menstrual cycle was a few weeks ago but patient does report that she had a positive pregnancy test in January of this year.  She states that she assumed that she had a miscarriage because she had some vaginal bleeding after the positive pregnancy test but did not seek care or evaluation for possible miscarriage.  Patient requesting STD testing and blood work for HIV and syphilis as well.  Denies any fevers. ? ?Patient answered yes to suicidal ideation to nursing triage questions.  When asked to elaborate, patient reports that she has had "depression for 3 years".  She states that it typically flares up after she has children, and she has had 3 deliveries.  She thinks that it was exacerbated after the miscarriage as her suicidal thoughts started and increased over the past month.  Patient states that she attempted to commit suicide approximately 3 days ago with "carbon monoxide poisoning".  When asked to elaborate, patient reports that she sat outside in her hot car with it turned off.  Car was not inside of garage or building at that time.  States that she was unsuccessful with that plan.  Denies any new plans to harm herself at this time.  Patient also reports that she has been homeless for approximately 3 years so she thinks this is contributing to her anxiety and depression.  Patient has never been seen or evaluated for any depression or suicidal  ideation. ? ? ? ?Vaginal Itching ? ? ?Past Medical History:  ?Diagnosis Date  ? Breast lump   ? ? ?Patient Active Problem List  ? Diagnosis Date Noted  ? Atypical squamous cells of undetermined significance (ASCUS) on Papanicolaou smear of cervix 03/19/2020  ? Marijuana use 03/19/2020  ? Tobacco abuse 03/19/2020  ? Anxiety 03/19/2020  ? Vaginal delivery 03/19/2020  ? Supervision of high risk pregnancy, antepartum 02/14/2020  ? IUGR (intrauterine growth restriction) affecting care of mother 02/14/2020  ? ? ?History reviewed. No pertinent surgical history. ? ?OB History   ? ? Gravida  ?5  ? Para  ?3  ? Term  ?3  ? Preterm  ?0  ? AB  ?1  ? Living  ?3  ?  ? ? SAB  ?1  ? IAB  ?0  ? Ectopic  ?0  ? Multiple  ?0  ? Live Births  ?3  ?   ?  ?  ? ? ? ?Home Medications   ? ?Prior to Admission medications   ?Medication Sig Start Date End Date Taking? Authorizing Provider  ?nitrofurantoin, macrocrystal-monohydrate, (MACROBID) 100 MG capsule Take 1 capsule (100 mg total) by mouth 2 (two) times daily. 08/01/21  Yes Gustavus BryantMound, Myesha Stillion E, FNP  ?metroNIDAZOLE (FLAGYL) 500 MG tablet Take 1 tablet (500 mg total) by mouth 2 (two) times daily. ?Patient not taking: Reported on 08/01/2021 03/24/21   Dartha LodgeFord, Kelsey N, PA-C  ? ? ?Family History ?Family History  ?Problem Relation Age of  Onset  ? Hypertension Mother   ? Hypertension Maternal Grandmother   ? ? ?Social History ?Social History  ? ?Tobacco Use  ? Smoking status: Every Day  ?  Packs/day: 1.00  ?  Types: Cigarettes, Cigars  ? Smokeless tobacco: Never  ?Vaping Use  ? Vaping Use: Some days  ? Substances: Nicotine, THC, Flavoring  ?Substance Use Topics  ? Alcohol use: Never  ? Drug use: Never  ? ? ? ?Allergies   ?Patient has no known allergies. ? ? ?Review of Systems ?Review of Systems ?Per HPI ? ?Physical Exam ?Triage Vital Signs ?ED Triage Vitals  ?Enc Vitals Group  ?   BP 08/01/21 1727 107/74  ?   Pulse Rate 08/01/21 1727 87  ?   Resp 08/01/21 1727 18  ?   Temp 08/01/21 1727 97.8 ?F (36.6 ?C)   ?   Temp Source 08/01/21 1727 Oral  ?   SpO2 08/01/21 1727 98 %  ?   Weight --   ?   Height --   ?   Head Circumference --   ?   Peak Flow --   ?   Pain Score 08/01/21 1724 0  ?   Pain Loc --   ?   Pain Edu? --   ?   Excl. in [place]? --   ? ?No data found. ? ?Updated Vital Signs ?BP 107/74 (BP Location: Right Arm)   Pulse 87   Temp 97.8 ?F (36.6 ?C) (Oral)   Resp 18   LMP 07/19/2021 (Exact Date)   SpO2 98%   Breastfeeding Unknown  ? ?Visual Acuity ?Right Eye Distance:   ?Left Eye Distance:   ?Bilateral Distance:   ? ?Right Eye Near:   ?Left Eye Near:    ?Bilateral Near:    ? ?Physical Exam ?Constitutional:   ?   General: She is not in acute distress. ?   Appearance: Normal appearance. She is not toxic-appearing or diaphoretic.  ?HENT:  ?   Head: Normocephalic and atraumatic.  ?Eyes:  ?   Extraocular Movements: Extraocular movements intact.  ?   Conjunctiva/sclera: Conjunctivae normal.  ?Cardiovascular:  ?   Rate and Rhythm: Normal rate and regular rhythm.  ?   Pulses: Normal pulses.  ?   Heart sounds: Normal heart sounds.  ?Pulmonary:  ?   Effort: Pulmonary effort is normal. No respiratory distress.  ?   Breath sounds: Normal breath sounds.  ?Abdominal:  ?   General: Abdomen is flat. Bowel sounds are normal. There is no distension.  ?   Palpations: Abdomen is soft.  ?   Tenderness: There is no abdominal tenderness.  ?Genitourinary: ?   Comments: Patient refused. Self swab performed. ?Musculoskeletal:  ?   Cervical back: Normal.  ?   Thoracic back: Normal.  ?   Lumbar back: Normal.  ?Neurological:  ?   General: No focal deficit present.  ?   Mental Status: She is alert and oriented to person, place, and time. Mental status is at baseline.  ?Psychiatric:     ?   Mood and Affect: Mood normal.     ?   Behavior: Behavior normal.     ?   Thought Content: Thought content normal.     ?   Judgment: Judgment normal.  ? ? ? ?UC Treatments / Results  ?Labs ?(all labs ordered are listed, but only abnormal results are  displayed) ?Labs Reviewed  ?POCT URINALYSIS DIP (MANUAL ENTRY) - Abnormal; Notable for the following  components:  ?    Result Value  ? Clarity, UA hazy (*)   ? pH, UA 8.0 (*)   ? Leukocytes, UA Small (1+) (*)   ? All other components within normal limits  ?URINE CULTURE  ?RPR  ?HIV ANTIBODY (ROUTINE TESTING W REFLEX)  ?POCT URINE PREGNANCY  ?CERVICOVAGINAL ANCILLARY ONLY  ? ? ?EKG ? ? ?Radiology ?No results found. ? ?Procedures ?Procedures (including critical care time) ? ?Medications Ordered in UC ?Medications - No data to display ? ?Initial Impression / Assessment and Plan / UC Course  ?I have reviewed the triage vital signs and the nursing notes. ? ?Pertinent labs & imaging results that were available during my care of the patient were reviewed by me and considered in my medical decision making (see chart for details). ? ?  ? ?Urinalysis has small amount of leukocytes which could be indicative of UTI and/or vaginitis.  Will treat with Macrobid antibiotic at this time.  Patient denies that she is breast-feeding so this should be safe.  Urine pregnancy was negative.  Cervicovaginal swab pending.  Will wait for results for urine culture and vaginal swab for any further treatment at this time.  Patient declined vaginal physical exam.  No concern for PID at this time.  HIV and RPR pending per patient request. ? ?Due to recent suicidal ideation and a suicide attempt, patient was advised that it would be best and safe for her to go to the hospital for further evaluation and management.  Patient refused to go to the hospital.  Risks associated with not going to the hospital were discussed with patient.  Patient voiced understanding.  I do think that it is low risk with patient suicidal ideation as she does not currently have a plan and patient states "it is not that serious".  Patient is also coherent and appears to be able to make sound decisions at this time.  Patient was advised about the behavioral health urgent care  and patient was agreeable to going to the urgent care after leaving this urgent care today.  I do think this is reasonable and this was discussed with supervising vision who was agreeable with this plan. ? ?Patient was given strict r

## 2021-08-01 NOTE — Discharge Instructions (Addendum)
It appears that you may have a urinary tract infection.  You are being treated with an antibiotic.  Urine culture and vaginal swab are pending.  We will call if they are positive and treat as appropriate.  Please refrain from sexual activity until test results and treatment are complete. ? ?Please go to provided address for behavioral health urgent care today for further evaluation and management of your depression symptoms. ?

## 2021-08-01 NOTE — ED Notes (Signed)
[facility] up pcp [facility] for June 20 at 3 pm and instructed patient on when to arrive  ? ?

## 2021-08-02 LAB — CERVICOVAGINAL ANCILLARY ONLY
Bacterial Vaginitis (gardnerella): POSITIVE — AB
Candida Glabrata: NEGATIVE
Candida Vaginitis: POSITIVE — AB
Chlamydia: NEGATIVE
Comment: NEGATIVE
Comment: NEGATIVE
Comment: NEGATIVE
Comment: NEGATIVE
Comment: NEGATIVE
Comment: NORMAL
Neisseria Gonorrhea: NEGATIVE
Trichomonas: NEGATIVE

## 2021-08-02 LAB — URINE CULTURE: Culture: <10000 — AB

## 2021-08-03 LAB — HIV ANTIBODY (ROUTINE TESTING W REFLEX): HIV Screen 4th Generation wRfx: NONREACTIVE

## 2021-08-03 LAB — RPR: RPR Ser Ql: NONREACTIVE

## 2021-08-05 ENCOUNTER — Telehealth (HOSPITAL_COMMUNITY): Payer: Self-pay

## 2021-08-05 MED ORDER — METRONIDAZOLE 500 MG PO TABS: 500.0000 mg | ORAL_TABLET | Freq: Two times a day (BID) | ORAL | 0 refills | Status: DC

## 2021-08-05 MED ORDER — FLUCONAZOLE 150 MG PO TABS: 150.0000 mg | ORAL_TABLET | Freq: Every day | ORAL | 0 refills | Status: DC

## 2021-08-05 NOTE — Progress Notes (Signed)
Attempted to reach patient x 1, unable to leave VM. Medications sent to pharmacy on file.

## 2021-08-28 ENCOUNTER — Ambulatory Visit
Admission: EM | Admit: 2021-08-28 | Discharge: 2021-08-28 | Disposition: A | Discharge status: Home / Self Care | Payer: Medicaid Other | Attending: Internal Medicine | Admitting: Internal Medicine

## 2021-08-28 ENCOUNTER — Encounter: Payer: Self-pay | Admitting: Emergency Medicine

## 2021-08-28 DIAGNOSIS — Z113 Encounter for screening for infections with a predominantly sexual mode of transmission: Secondary | ICD-10-CM

## 2021-08-28 DIAGNOSIS — N898 Other specified noninflammatory disorders of vagina: Secondary | ICD-10-CM | POA: Diagnosis present

## 2021-08-28 MED ORDER — METRONIDAZOLE 500 MG PO TABS
500.0000 mg | ORAL_TABLET | Freq: Two times a day (BID) | ORAL | 0 refills | Status: DC
Start: 2021-08-28 — End: 2022-06-05

## 2021-08-28 NOTE — ED Triage Notes (Signed)
Patient c/o vaginal discharge and odor.  Patient was recently treated for BV and lost the medication so she didn't complete treatment.

## 2021-08-28 NOTE — ED Provider Notes (Signed)
[facility]    CSN: [id number] Arrival date & time: 08/28/21  1249      History   Chief Complaint Chief Complaint  Patient presents with   Vaginal Discharge    HPI Jamie [name] is a 26 y.o. female.   Patient presents to get a medication refill on metronidazole.  Patient was seen approximately 1 month ago and tested positive for bacterial vaginosis as well as vaginal yeast.  Patient reports that she lost the metronidazole so only took 1 pill of it.  She has been having persistent vaginal discharge and odor since last urgent care visit.  Denies dysuria, urinary frequency, abdominal pain, pelvic pain, fever, back pain.  Denies any new sexual partners.  Patient denies concern or chance for pregnancy as well.   Vaginal Discharge  Past Medical History:  Diagnosis Date   Breast lump     Patient Active Problem List   Diagnosis Date Noted   Atypical squamous cells of undetermined significance (ASCUS) on Papanicolaou smear of cervix 03/19/2020   Marijuana use 03/19/2020   Tobacco abuse 03/19/2020   Anxiety 03/19/2020   Vaginal delivery 03/19/2020   Supervision of high risk pregnancy, antepartum 02/14/2020   IUGR (intrauterine growth restriction) affecting care of mother 02/14/2020    History reviewed. No pertinent surgical history.  OB History     Gravida  5   Para  3   Term  3   Preterm  0   AB  1   Living  3      SAB  1   IAB  0   Ectopic  0   Multiple  0   Live Births  3            Home Medications    Prior to Admission medications   Medication Sig Start Date End Date Taking? Authorizing Provider  fluconazole (DIFLUCAN) 150 MG tablet Take 1 tablet (150 mg total) by mouth daily. 08/05/21   Lamptey, Myrene Galas, MD  metroNIDAZOLE (FLAGYL) 500 MG tablet Take 1 tablet (500 mg total) by mouth 2 (two) times daily. 08/28/21   Teodora Medici, FNP  nitrofurantoin, macrocrystal-monohydrate, (MACROBID) 100 MG capsule Take 1  capsule (100 mg total) by mouth 2 (two) times daily. 08/01/21   Teodora Medici, FNP    Family History Family History  Problem Relation Age of Onset   Hypertension Mother    Hypertension Maternal Grandmother     Social History Social History   Tobacco Use   Smoking status: Every Day    Packs/day: 1.00    Types: Cigarettes, Cigars   Smokeless tobacco: Never  Vaping Use   Vaping Use: Some days   Substances: Nicotine, THC, Flavoring  Substance Use Topics   Alcohol use: Never   Drug use: Never     Allergies   Patient has no known allergies.   Review of Systems Review of Systems Per HPI  Physical Exam Triage Vital Signs ED Triage Vitals  Enc Vitals Group     BP 08/28/21 1330 114/74     Pulse Rate 08/28/21 1330 77     Resp 08/28/21 1330 18     Temp 08/28/21 1330 98.2 F (36.8 C)     Temp Source 08/28/21 1330 Oral     SpO2 08/28/21 1330 98 %     Weight 08/28/21 1331 126 lb 12.2 oz (57.5 kg)     Height 08/28/21 1331 5\' 9"  (1.753 m)     Head  Circumference --      Peak Flow --      Pain Score 08/28/21 1331 0     Pain Loc --      Pain Edu? --      Excl. in [place]? --    No data found.  Updated Vital Signs BP 114/74 (BP Location: Left Arm)   Pulse 77   Temp 98.2 F (36.8 C) (Oral)   Resp 18   Ht 5\' 9"  (1.753 m)   Wt 126 lb 12.2 oz (57.5 kg)   LMP 08/18/2021 (Exact Date)   SpO2 98%   Breastfeeding No   BMI 18.72 kg/m   Visual Acuity Right Eye Distance:   Left Eye Distance:   Bilateral Distance:    Right Eye Near:   Left Eye Near:    Bilateral Near:     Physical Exam Constitutional:      General: She is not in acute distress.    Appearance: Normal appearance. She is not toxic-appearing or diaphoretic.  HENT:     Head: Normocephalic and atraumatic.  Eyes:     Extraocular Movements: Extraocular movements intact.     Conjunctiva/sclera: Conjunctivae normal.  Cardiovascular:     Rate and Rhythm: Normal rate and regular rhythm.     Pulses: Normal  pulses.     Heart sounds: Normal heart sounds.  Pulmonary:     Effort: Pulmonary effort is normal. No respiratory distress.     Breath sounds: Normal breath sounds.  Abdominal:     General: Abdomen is flat. Bowel sounds are normal. There is no distension.     Palpations: Abdomen is soft.     Tenderness: There is no abdominal tenderness.  Genitourinary:    Comments: Deferred with shared decision-making.  Self swab performed. Neurological:     General: No focal deficit present.     Mental Status: She is alert and oriented to person, place, and time. Mental status is at baseline.  Psychiatric:        Mood and Affect: Mood normal.        Behavior: Behavior normal.        Thought Content: Thought content normal.        Judgment: Judgment normal.     UC Treatments / Results  Labs (all labs ordered are listed, but only abnormal results are displayed) Labs Reviewed  CERVICOVAGINAL ANCILLARY ONLY    EKG   Radiology No results found.  Procedures Procedures (including critical care time)  Medications Ordered in UC Medications - No data to display  Initial Impression / Assessment and Plan / UC Course  I have reviewed the triage vital signs and the nursing notes.  Pertinent labs & imaging results that were available during my care of the patient were reviewed by me and considered in my medical decision making (see chart for details).     Given persistent symptoms and patient only taking part of treatment, will send metronidazole to treat bacterial vaginosis.  Cervicovaginal swab pending again.  No concern for PID.  Patient to refrain from sexual activity until test results and treatment are complete.  Discussed return precautions.  Patient verbalized understanding and was agreeable with plan. Final Clinical Impressions(s) / UC Diagnoses   Final diagnoses:  Vaginal discharge  Vaginal odor  Screening examination for venereal disease     Discharge Instructions      Flagyl  to treat bacterial vaginosis has been resent to your pharmacy.  Your vaginal swab is pending.  We will call if results are positive.  Please refrain from sexual activity until test results and treatment are complete    ED Prescriptions     Medication Sig Dispense Auth. Provider   metroNIDAZOLE (FLAGYL) 500 MG tablet Take 1 tablet (500 mg total) by mouth 2 (two) times daily. 14 tablet [place], Michele Rockers, [place]      PDMP not reviewed this encounter.   Teodora Medici, [place] 08/28/21 1409

## 2021-08-28 NOTE — Discharge Instructions (Signed)
Flagyl to treat bacterial vaginosis has been resent to your pharmacy.  Your vaginal swab is pending.  We will call if results are positive.  Please refrain from sexual activity until test results and treatment are complete

## 2021-08-30 LAB — CERVICOVAGINAL ANCILLARY ONLY
Bacterial Vaginitis (gardnerella): POSITIVE — AB
Candida Glabrata: NEGATIVE
Candida Vaginitis: NEGATIVE
Chlamydia: NEGATIVE
Comment: NEGATIVE
Comment: NEGATIVE
Comment: NEGATIVE
Comment: NEGATIVE
Comment: NEGATIVE
Comment: NORMAL
Neisseria Gonorrhea: NEGATIVE
Trichomonas: POSITIVE — AB

## 2021-10-24 ENCOUNTER — Ambulatory Visit: Payer: Medicaid Other | Admitting: Family Medicine

## 2022-02-05 ENCOUNTER — Encounter (HOSPITAL_COMMUNITY): Payer: Self-pay | Admitting: Emergency Medicine

## 2022-02-05 ENCOUNTER — Ambulatory Visit (HOSPITAL_COMMUNITY)
Admission: EM | Admit: 2022-02-05 | Discharge: 2022-02-05 | Disposition: A | Discharge status: Home / Self Care | Payer: Medicaid Other | Attending: Internal Medicine | Admitting: Internal Medicine

## 2022-02-05 DIAGNOSIS — B3731 Acute candidiasis of vulva and vagina: Secondary | ICD-10-CM | POA: Insufficient documentation

## 2022-02-05 LAB — POCT URINALYSIS DIPSTICK, ED / UC
Bilirubin Urine: NEGATIVE
Glucose, UA: NEGATIVE mg/dL
Hgb urine dipstick: NEGATIVE
Ketones, ur: NEGATIVE mg/dL
Nitrite: NEGATIVE
Protein, ur: NEGATIVE mg/dL
Specific Gravity, Urine: 1.025 (ref 1.005–1.030)
Urobilinogen, UA: 1 mg/dL (ref 0.0–1.0)
pH: 7 (ref 5.0–8.0)

## 2022-02-05 LAB — HIV ANTIBODY (ROUTINE TESTING W REFLEX): HIV Screen 4th Generation wRfx: NONREACTIVE

## 2022-02-05 LAB — HEPATITIS C ANTIBODY: HCV Ab: NONREACTIVE

## 2022-02-05 MED ORDER — FLUCONAZOLE 150 MG PO TABS: 150.0000 mg | ORAL_TABLET | Freq: Every day | ORAL | 0 refills | Status: DC

## 2022-02-05 NOTE — Discharge Instructions (Signed)
You have a yeast infection Take the diflucan as prescribed We will call you if STD tests are positive  It was nice to meet you today!

## 2022-02-05 NOTE — ED Triage Notes (Signed)
Pt c/o vaginal itching and skin peeling for about 4 days.  Denies using any new products. Denies vaginal discharge. Wants STD testing.

## 2022-02-05 NOTE — ED Provider Notes (Signed)
[facility]   [id number] 02/05/22 Arrival Time: 1121  ASSESSMENT & PLAN:  1. Vaginal candidiasis    -History exam consistent with yeast infection.  We will treat with Diflucan.  Aptima swab collected and sent for confirmation of diagnosis.  STD cotesting with swab and patient requesting blood test for HIV, RPR and hep C as well.  Patient declined pregnancy test today as she recently completed her period 2 days ago.  We will call her if STD tests are positive.  All questions were answered and she agrees to plan.  Meds ordered this encounter  Medications   fluconazole (DIFLUCAN) 150 MG tablet    Sig: Take 1 tablet (150 mg total) by mouth daily. If no improvement after 3 days, take the second pill    Dispense:  2 tablet    Refill:  0     Discharge Instructions      You have a yeast infection Take the diflucan as prescribed We will call you if STD tests are positive  It was nice to meet you today!        Reviewed expectations re: course of current medical issues. Questions answered. Outlined signs and symptoms indicating need for more acute intervention. Patient verbalized understanding. After Visit Summary given.   SUBJECTIVE: 26 year old Jamie [name] comes to urgent care to be evaluated for vaginal itching.  This is been present for about 4 days.  She reports skin peeling and itchy sensation.  Denies any new products.  Denies any vaginal discharge.  She is sexually active.  She does not use condoms or any form of birth control.  She just finished her period 2 days ago.  She declines pregnancy test today.  She wants STD testing.  No history of yeast infections.  She does have a history of BV from 5 months ago.  She denies any fevers, abdominal pain, vaginal discharge, vaginal bleeding.  Patient's last menstrual period was 01/31/2022. History reviewed. No pertinent surgical history.   OBJECTIVE:  Vitals:   02/05/22 1318  BP: 128/87  Pulse: 86  Resp: 14   Temp: 97.9 F (36.6 C)  TempSrc: Oral  SpO2: 100%     Physical Exam Vitals and nursing note reviewed. Exam conducted with a chaperone present.  Constitutional:      General: She is not in acute distress.    Appearance: Normal appearance.  HENT:     Head: Normocephalic.  Cardiovascular:     Rate and Rhythm: Normal rate.  Pulmonary:     Effort: Pulmonary effort is normal.  Abdominal:     General: Abdomen is flat.     Palpations: Abdomen is soft.  Genitourinary:    General: Normal vulva.     Vagina: Vaginal discharge (white thick discharge) present.     Cervix: No erythema.  Musculoskeletal:        General: Normal range of motion.  Skin:    General: Skin is warm.      Labs: Results for orders placed or performed during the hospital encounter of 08/28/21  Cervicovaginal ancillary only  Result Value Ref Range   Neisseria Gonorrhea Negative    Chlamydia Negative    Trichomonas Positive (A)    Bacterial Vaginitis (gardnerella) Positive (A)    Candida Vaginitis Negative    Candida Glabrata Negative    Comment Normal Reference Range Candida Species - Negative    Comment Normal Reference Range Candida Galbrata - Negative    Comment Normal Reference Range Trichomonas - Negative  Comment Normal Reference Ranger Chlamydia - Negative    Comment      Normal Reference Range Neisseria Gonorrhea - Negative   Comment      Normal Reference Range Bacterial Vaginosis - Negative   Labs Reviewed  HIV ANTIBODY (ROUTINE TESTING W REFLEX)  RPR  HEPATITIS C ANTIBODY  CERVICOVAGINAL ANCILLARY ONLY    Imaging: No results found.   No Known Allergies                                             Past Medical History:  Diagnosis Date   Breast lump     Social History   Socioeconomic History   Marital status: Single    Spouse name: Not on file   Number of children: Not on file   Years of education: Not on file   Highest education level: Not on file  Occupational History    Not on file  Tobacco Use   Smoking status: Every Day    Packs/day: 1.00    Types: Cigarettes, Cigars   Smokeless tobacco: Never  Vaping Use   Vaping Use: Some days   Substances: Nicotine, THC, Flavoring  Substance and Sexual Activity   Alcohol use: Never   Drug use: Never   Sexual activity: Yes  Other Topics Concern   Not on file  Social History Narrative   Not on file   Social Determinants of Health   Financial Resource Strain: Not on file  Food Insecurity: Not on file  Transportation Needs: Not on file  Physical Activity: Not on file  Stress: Not on file  Social Connections: Not on file  Intimate Partner Violence: Not on file    Family History  Problem Relation Age of Onset   Hypertension Mother    Hypertension Maternal Grandmother       Darnesha Diloreto, Dorian Pod, MD 02/05/22 1348

## 2022-02-06 ENCOUNTER — Telehealth (HOSPITAL_COMMUNITY): Payer: Self-pay | Admitting: Emergency Medicine

## 2022-02-06 LAB — CERVICOVAGINAL ANCILLARY ONLY
Bacterial Vaginitis (gardnerella): POSITIVE — AB
Candida Glabrata: NEGATIVE
Candida Vaginitis: POSITIVE — AB
Chlamydia: NEGATIVE
Comment: NEGATIVE
Comment: NEGATIVE
Comment: NEGATIVE
Comment: NEGATIVE
Comment: NEGATIVE
Comment: NORMAL
Neisseria Gonorrhea: NEGATIVE
Trichomonas: NEGATIVE

## 2022-02-06 LAB — RPR: RPR Ser Ql: NONREACTIVE

## 2022-02-06 MED ORDER — METRONIDAZOLE 0.75 % VA GEL: 1.0000 | Freq: Every day | VAGINAL | 0 refills | Status: AC

## 2022-04-07 NOTE — L&D Delivery Note (Signed)
[facility] Delivery Note  Jamie [name] is a 27 y.o. [id number] s/p SVD at [redacted]w[redacted]d. She was admitted for IOL due to FGR.   ROM: 4h 45m with clear fluid GBS Status: Negative/-- (10/03 0130) Maximum Maternal Temperature: 97.50F   Labor Progress: Initial SVE: 2/20/-3. She then progressed to complete.   Delivery Date/Time: 01/12/23 0759 Delivery: Called to room and patient was complete and pushing. Head delivered LOP. Infant with right arm nuchal that was noted after delivery. Shoulder and body delivered in usual fashion. Infant with spontaneous cry, placed on mother's abdomen, dried and stimulated. Cord clamped x 2 after 1-minute delay, and cut by father of baby. Cord blood drawn. Placenta delivered spontaneously with gentle cord traction. Fundus firm with massage and Pitocin. Labia, perineum, vagina, and cervix inspected inspected with no lacerations .  Baby Weight: pending  Placenta: Sent to pathology Complications: None Lacerations: None EBL: 71 mL Analgesia: Unmedicated   Infant:  APGAR (1 MIN):  8 APGAR (5 MINS):  9

## 2022-06-05 ENCOUNTER — Encounter (HOSPITAL_COMMUNITY): Payer: Self-pay | Admitting: *Deleted

## 2022-06-05 ENCOUNTER — Inpatient Hospital Stay (HOSPITAL_COMMUNITY): Payer: Medicaid Other

## 2022-06-05 ENCOUNTER — Inpatient Hospital Stay (HOSPITAL_COMMUNITY)
Admission: AD | Admit: 2022-06-05 | Discharge: 2022-06-05 | Disposition: A | Discharge status: Home / Self Care | Payer: Medicaid Other | Attending: Obstetrics and Gynecology | Admitting: Obstetrics and Gynecology

## 2022-06-05 DIAGNOSIS — F17290 Nicotine dependence, other tobacco product, uncomplicated: Secondary | ICD-10-CM | POA: Diagnosis not present

## 2022-06-05 DIAGNOSIS — R109 Unspecified abdominal pain: Secondary | ICD-10-CM | POA: Insufficient documentation

## 2022-06-05 DIAGNOSIS — Z3490 Encounter for supervision of normal pregnancy, unspecified, unspecified trimester: Secondary | ICD-10-CM

## 2022-06-05 DIAGNOSIS — O26899 Other specified pregnancy related conditions, unspecified trimester: Secondary | ICD-10-CM

## 2022-06-05 DIAGNOSIS — O99331 Smoking (tobacco) complicating pregnancy, first trimester: Secondary | ICD-10-CM | POA: Insufficient documentation

## 2022-06-05 DIAGNOSIS — Z3A01 Less than 8 weeks gestation of pregnancy: Secondary | ICD-10-CM | POA: Diagnosis not present

## 2022-06-05 DIAGNOSIS — O26891 Other specified pregnancy related conditions, first trimester: Secondary | ICD-10-CM | POA: Diagnosis present

## 2022-06-05 HISTORY — DX: Urinary tract infection, site not specified: N39.0

## 2022-06-05 HISTORY — DX: Depression, unspecified: F32.A

## 2022-06-05 HISTORY — DX: Gestational (pregnancy-induced) hypertension without significant proteinuria, unspecified trimester: O13.9

## 2022-06-05 HISTORY — DX: Essential (primary) hypertension: I10

## 2022-06-05 LAB — URINALYSIS, ROUTINE W REFLEX MICROSCOPIC
Bilirubin Urine: NEGATIVE
Glucose, UA: NEGATIVE mg/dL
Hgb urine dipstick: NEGATIVE
Ketones, ur: NEGATIVE mg/dL
Leukocytes,Ua: NEGATIVE
Nitrite: NEGATIVE
Protein, ur: 30 mg/dL — AB
Specific Gravity, Urine: 1.025 (ref 1.005–1.030)
pH: 6.5 (ref 5.0–8.0)

## 2022-06-05 LAB — CBC
HCT: 40.7 % (ref 36.0–46.0)
Hemoglobin: 14.1 g/dL (ref 12.0–15.0)
MCH: 31.6 pg (ref 26.0–34.0)
MCHC: 34.6 g/dL (ref 30.0–36.0)
MCV: 91.3 fL (ref 80.0–100.0)
Platelets: 281 10*3/uL (ref 150–400)
RBC: 4.46 MIL/uL (ref 3.87–5.11)
RDW: 12.9 % (ref 11.5–15.5)
WBC: 5.5 10*3/uL (ref 4.0–10.5)
nRBC: 0 % (ref 0.0–0.2)

## 2022-06-05 LAB — URINALYSIS, MICROSCOPIC (REFLEX): Bacteria, UA: NONE SEEN

## 2022-06-05 LAB — WET PREP, GENITAL
Clue Cells Wet Prep HPF POC: NONE SEEN
Sperm: NONE SEEN
Trich, Wet Prep: NONE SEEN
WBC, Wet Prep HPF POC: >=10 — AB (ref <10)
Yeast Wet Prep HPF POC: NONE SEEN

## 2022-06-05 LAB — HIV ANTIBODY (ROUTINE TESTING W REFLEX): HIV Screen 4th Generation wRfx: NONREACTIVE

## 2022-06-05 LAB — POCT PREGNANCY, URINE: Preg Test, Ur: POSITIVE — AB

## 2022-06-05 LAB — HCG, QUANTITATIVE, PREGNANCY: hCG, Beta Chain, Quant, S: 36585 m[IU]/mL — ABNORMAL HIGH (ref <5)

## 2022-06-05 NOTE — MAU Provider Note (Signed)
History     CSN: [id number]  Arrival date and time: 06/05/22 1450   Event Date/Time   First Provider Initiated Contact with Patient 06/05/22 1538      Chief Complaint  Patient presents with   Abdominal Pain   HPI [name] is a 27 y.o. year old [phone] female at 55w0dweeks gestation who presents to MAU reporting (+) HPT a few hours prior to arriving in MAU. She reports lower abdominal cramping the last few days. She reports spotting after SI yesterday; none since. She has had diarrhea and threw up once this morning. Not nauseated. She describes the pain as mild.   OB History     Gravida  6   Para  3   Term  3   Preterm  0   AB  2   Living  3      SAB  2   IAB  0   Ectopic  0   Multiple  0   Live Births  3           Past Medical History:  Diagnosis Date   Breast lump    Depression    Hypertension    Pregnancy induced hypertension    UTI (urinary tract infection)     Past Surgical History:  Procedure Laterality Date   NO PAST SURGERIES      Family History  Problem Relation Age of Onset   Hypertension Mother    Healthy Father    Hypertension Maternal Grandmother     Social History   Tobacco Use   Smoking status: Every Day    Types: Cigars   Smokeless tobacco: Never  Vaping Use   Vaping Use: Former   Substances: Nicotine, THC, Flavoring  Substance Use Topics   Alcohol use: Yes    Comment: once a month   Drug use: Yes    Frequency: 7.0 times per week    Types: Marijuana    Allergies: No Known Allergies  Medications Prior to Admission  Medication Sig Dispense Refill Last Dose   fluconazole (DIFLUCAN) 150 MG tablet Take 1 tablet (150 mg total) by mouth daily. If no improvement after 3 days, take the second pill 2 tablet 0    metroNIDAZOLE (FLAGYL) 500 MG tablet Take 1 tablet (500 mg total) by mouth 2 (two) times daily. 14 tablet 0    nitrofurantoin, macrocrystal-monohydrate, (MACROBID) 100 MG capsule Take  1 capsule (100 mg total) by mouth 2 (two) times daily. 10 capsule 0     Review of Systems  Constitutional: Negative.   HENT: Negative.    Eyes: Negative.   Respiratory: Negative.    Cardiovascular: Negative.   Gastrointestinal: Negative.   Endocrine: Negative.   Genitourinary:  Positive for pelvic pain.  Musculoskeletal: Negative.   Skin: Negative.   Allergic/Immunologic: Negative.   Neurological: Negative.   Hematological: Negative.   Psychiatric/Behavioral: Negative.     Physical Exam   Blood pressure 121/68, pulse 92, temperature 98.1 F (36.7 C), temperature source Oral, resp. rate 16, height '5\' 9"'$  (1.753 m), weight 58 kg, last menstrual period 04/24/2022, SpO2 100 %.  Physical Exam Vitals and nursing note reviewed.  Constitutional:      Appearance: Normal appearance. She is normal weight.  Cardiovascular:     Rate and Rhythm: Normal rate.  Pulmonary:     Effort: Pulmonary effort is normal.  Abdominal:     Palpations: Abdomen is soft.  Genitourinary:    Comments: Deferred>self-swab  by patient Musculoskeletal:        General: Normal range of motion.  Skin:    General: Skin is warm and dry.  Neurological:     Mental Status: She is alert and oriented to person, place, and time.  Psychiatric:        Mood and Affect: Mood normal.        Behavior: Behavior normal.        Thought Content: Thought content normal.        Judgment: Judgment normal.   MAU Course  Procedures  MDM CCUA UPT CBC ABO/Rh HCG Wet Prep GC/CT -- pending HIV -- pending OB < 14 wks [place] with TV  Results for orders placed or performed during the hospital encounter of 06/05/22 (from the past 24 hour(s))  Urinalysis, Routine w reflex microscopic -Urine, Clean Catch     Status: Abnormal   Collection Time: 06/05/22  3:00 PM  Result Value Ref Range   Color, Urine YELLOW YELLOW   APPearance CLEAR CLEAR   Specific Gravity, Urine 1.025 1.005 - 1.030   pH 6.5 5.0 - 8.0   Glucose, UA NEGATIVE  NEGATIVE mg/dL   Hgb urine dipstick NEGATIVE NEGATIVE   Bilirubin Urine NEGATIVE NEGATIVE   Ketones, ur NEGATIVE NEGATIVE mg/dL   Protein, ur 30 (A) NEGATIVE mg/dL   Nitrite NEGATIVE NEGATIVE   Leukocytes,Ua NEGATIVE NEGATIVE  Urinalysis, Microscopic (reflex)     Status: None   Collection Time: 06/05/22  3:00 PM  Result Value Ref Range   RBC / HPF 0-5 0 - 5 RBC/hpf   WBC, UA 0-5 0 - 5 WBC/hpf   Bacteria, UA NONE SEEN NONE SEEN   Squamous Epithelial / HPF 21-50 0 - 5 /HPF   Mucus PRESENT   Pregnancy, urine POC     Status: Abnormal   Collection Time: 06/05/22  3:08 PM  Result Value Ref Range   Preg Test, Ur POSITIVE (A) NEGATIVE  CBC     Status: None   Collection Time: 06/05/22  3:50 PM  Result Value Ref Range   WBC 5.5 4.0 - 10.5 K/uL   RBC 4.46 3.87 - 5.11 MIL/uL   Hemoglobin 14.1 12.0 - 15.0 g/dL   HCT 40.7 36.0 - 46.0 %   MCV 91.3 80.0 - 100.0 fL   MCH 31.6 26.0 - 34.0 pg   MCHC 34.6 30.0 - 36.0 g/dL   RDW 12.9 11.5 - 15.5 %   Platelets 281 150 - 400 K/uL   nRBC 0.0 0.0 - 0.2 %  hCG, quantitative, pregnancy     Status: Abnormal   Collection Time: 06/05/22  3:50 PM  Result Value Ref Range   hCG, Beta Chain, Quant, S 36,585 (H) <5 mIU/mL  HIV Antibody (routine testing w rflx)     Status: None   Collection Time: 06/05/22  3:50 PM  Result Value Ref Range   HIV Screen 4th Generation wRfx Non Reactive Non Reactive  Wet prep, genital     Status: Abnormal   Collection Time: 06/05/22  3:52 PM   Specimen: Vaginal  Result Value Ref Range   Yeast Wet Prep HPF POC NONE SEEN NONE SEEN   Trich, Wet Prep NONE SEEN NONE SEEN   Clue Cells Wet Prep HPF POC NONE SEEN NONE SEEN   WBC, Wet Prep HPF POC >=10 (A) <10   Sperm NONE SEEN     US OB LESS THAN 14 WEEKS WITH OB TRANSVAGINAL  Result Date: 06/05/2022 CLINICAL DATA:  Spotting affecting pregnancy in first trimester. EXAM: OBSTETRIC <14 WK [place] AND TRANSVAGINAL OB US TECHNIQUE: Both transabdominal and transvaginal ultrasound  examinations were performed for complete evaluation of the gestation as well as the maternal uterus, adnexal regions, and pelvic cul-de-sac. Transvaginal technique was performed to assess early pregnancy. COMPARISON:  None this pregnancy available. FINDINGS: Intrauterine gestational sac: Single. Yolk sac:  Visualized. Embryo:  Visualized. Cardiac Activity: Visualized. Heart Rate: 112 bpm CRL:  2.9 mm   5 w   6 d                  [place] EDC: 01/30/2023 Subchorionic hemorrhage:  None visualized. Maternal uterus/adnexae: Both ovaries are visualized and are normal. Ovarian blood flow is seen. No adnexal mass. No pelvic free fluid. IMPRESSION: 1. Single live intrauterine pregnancy estimated gestational age [redacted] weeks 6 days based on crown-rump length for ultrasound [facility] 01/30/2023. 2. No subchorionic hemorrhage. Electronically Signed   By: Keith Rake M.D.   On: 06/05/2022 16:27     Assessment and Plan  1. Intrauterine pregnancy - Start prenatal care - Patient unsure if she will terminate or continue pregnancy  she verbalizes she will more than likely keep the baby because it is going to be a [place]orp[place]  2. Cramping affecting pregnancy, antepartum - Information provided on abdominal pain in pregnancy   3. [redacted] weeks gestation of pregnancy  - Discharge patient - Patient verbalized an understanding of the plan of care and agrees.   Laury Deep, CNM 06/05/2022, 3:38 PM

## 2022-06-05 NOTE — MAU Note (Signed)
Jamie [name] is a 27 y.o. at Unknown here in MAU reporting: +HPT  a few hrs ago, has been cramping in lower abd the last few days.  Spotted yesterday when had sex, none since.   Had diarrhea x2 this morning and threw up. Not really nauseated. LMP: 1/18 Onset of complaint: few days ago Pain score: mild Vitals:   06/05/22 1518  BP: 121/68  Pulse: 92  Resp: 16  Temp: 98.1 F (36.7 C)  SpO2: 100%      Lab orders placed from triage:  urine after +UPT

## 2022-06-05 NOTE — Discharge Instructions (Signed)

## 2022-06-06 LAB — GC/CHLAMYDIA PROBE AMP (~~LOC~~) NOT AT ARMC
Chlamydia: NEGATIVE
Comment: NEGATIVE
Comment: NORMAL
Neisseria Gonorrhea: POSITIVE — AB

## 2022-06-09 ENCOUNTER — Telehealth: Payer: Self-pay | Admitting: Obstetrics and Gynecology

## 2022-06-09 NOTE — Telephone Encounter (Signed)
No answer. Unable to LVM. Message received that "the person you are trying to call does not have a voicemail that is setup yet."  Laury Deep, CNM

## 2022-06-17 ENCOUNTER — Encounter: Payer: Self-pay | Admitting: *Deleted

## 2022-06-17 ENCOUNTER — Telehealth: Payer: Self-pay | Admitting: *Deleted

## 2022-06-17 NOTE — Telephone Encounter (Signed)
Received message from Laury Deep, CNM she attempted to call patient with results of Positive for gonorrhea and did not reach her. I called today and heard message" Voicemail not set up". Will need to call again . STD form for health department started and sent with note not reached patient yet Staci Acosta

## 2022-06-18 NOTE — Telephone Encounter (Signed)
Pt notified of results and to make sure partner(s) is treated as well.   Pt agreed to come in tomorrow at 1445 for gonorrhea treatment.    Frances Nickels  06/18/22

## 2022-06-19 ENCOUNTER — Ambulatory Visit (INDEPENDENT_AMBULATORY_CARE_PROVIDER_SITE_OTHER): Payer: Medicaid Other | Admitting: *Deleted

## 2022-06-19 ENCOUNTER — Other Ambulatory Visit (HOSPITAL_COMMUNITY)
Admission: RE | Admit: 2022-06-19 | Discharge: 2022-06-19 | Disposition: A | Discharge status: Home / Self Care | Payer: Medicaid Other | Source: Ambulatory Visit | Attending: Family Medicine | Admitting: Family Medicine

## 2022-06-19 VITALS — BP 108/66 | HR 85 | Ht 69.0 in | Wt 130.6 lb

## 2022-06-19 DIAGNOSIS — N898 Other specified noninflammatory disorders of vagina: Secondary | ICD-10-CM | POA: Diagnosis present

## 2022-06-19 DIAGNOSIS — O26891 Other specified pregnancy related conditions, first trimester: Secondary | ICD-10-CM | POA: Diagnosis present

## 2022-06-19 DIAGNOSIS — Z3A08 8 weeks gestation of pregnancy: Secondary | ICD-10-CM

## 2022-06-19 DIAGNOSIS — O98211 Gonorrhea complicating pregnancy, first trimester: Secondary | ICD-10-CM

## 2022-06-19 MED ORDER — CEFTRIAXONE SODIUM 500 MG IJ SOLR
500.0000 mg | Freq: Once | INTRAMUSCULAR | Status: AC
  Administered 2022-06-19: 500 mg via INTRAMUSCULAR

## 2022-06-19 NOTE — Progress Notes (Signed)
Pt presents for Rocephin injection. She was previously seen @ MAU on 2/29 with vaginal swab showing Gonorrhea positive. Today she also reports having white vaginal d/c and itching x2 days. Rocephin 500 mg IM administered without complication. Self swab obtained and sent to lab. Pt was advised she will be notified of results via [place]. Pt was also advised to schedule appointments to begin prenatal care in this office. She voiced understanding of all information and instructions given.

## 2022-06-23 ENCOUNTER — Encounter: Payer: Self-pay | Admitting: Obstetrics and Gynecology

## 2022-06-23 ENCOUNTER — Other Ambulatory Visit: Payer: Self-pay | Admitting: Obstetrics and Gynecology

## 2022-06-23 DIAGNOSIS — A5901 Trichomonal vulvovaginitis: Secondary | ICD-10-CM | POA: Insufficient documentation

## 2022-06-23 DIAGNOSIS — O23591 Infection of other part of genital tract in pregnancy, first trimester: Secondary | ICD-10-CM | POA: Insufficient documentation

## 2022-06-23 LAB — CERVICOVAGINAL ANCILLARY ONLY
Bacterial Vaginitis (gardnerella): POSITIVE — AB
Candida Glabrata: NEGATIVE
Candida Vaginitis: POSITIVE — AB
Comment: NEGATIVE
Comment: NEGATIVE
Comment: NEGATIVE
Comment: NEGATIVE
Trichomonas: POSITIVE — AB

## 2022-06-23 MED ORDER — MICONAZOLE NITRATE 2 % VA CREA: 1.0000 | TOPICAL_CREAM | Freq: Every day | VAGINAL | 0 refills | Status: DC

## 2022-06-23 MED ORDER — METRONIDAZOLE 500 MG PO TABS: 500.0000 mg | ORAL_TABLET | Freq: Two times a day (BID) | ORAL | 0 refills | Status: AC

## 2022-07-26 ENCOUNTER — Inpatient Hospital Stay (HOSPITAL_COMMUNITY)
Admission: AD | Admit: 2022-07-26 | Discharge: 2022-07-26 | Disposition: A | Discharge status: Home / Self Care | Payer: Medicaid Other | Attending: Obstetrics & Gynecology | Admitting: Obstetrics & Gynecology

## 2022-07-26 ENCOUNTER — Other Ambulatory Visit: Payer: Self-pay

## 2022-07-26 DIAGNOSIS — O98311 Other infections with a predominantly sexual mode of transmission complicating pregnancy, first trimester: Secondary | ICD-10-CM | POA: Insufficient documentation

## 2022-07-26 DIAGNOSIS — N930 Postcoital and contact bleeding: Secondary | ICD-10-CM

## 2022-07-26 DIAGNOSIS — Z3A13 13 weeks gestation of pregnancy: Secondary | ICD-10-CM | POA: Insufficient documentation

## 2022-07-26 DIAGNOSIS — O23591 Infection of other part of genital tract in pregnancy, first trimester: Secondary | ICD-10-CM | POA: Diagnosis not present

## 2022-07-26 DIAGNOSIS — A5901 Trichomonal vulvovaginitis: Secondary | ICD-10-CM | POA: Insufficient documentation

## 2022-07-26 DIAGNOSIS — R42 Dizziness and giddiness: Secondary | ICD-10-CM | POA: Diagnosis not present

## 2022-07-26 DIAGNOSIS — O98811 Other maternal infectious and parasitic diseases complicating pregnancy, first trimester: Secondary | ICD-10-CM | POA: Insufficient documentation

## 2022-07-26 DIAGNOSIS — O208 Other hemorrhage in early pregnancy: Secondary | ICD-10-CM | POA: Diagnosis present

## 2022-07-26 DIAGNOSIS — B3731 Acute candidiasis of vulva and vagina: Secondary | ICD-10-CM | POA: Insufficient documentation

## 2022-07-26 DIAGNOSIS — Z3491 Encounter for supervision of normal pregnancy, unspecified, first trimester: Secondary | ICD-10-CM

## 2022-07-26 DIAGNOSIS — O26891 Other specified pregnancy related conditions, first trimester: Secondary | ICD-10-CM | POA: Diagnosis not present

## 2022-07-26 LAB — WET PREP, GENITAL
Sperm: NONE SEEN
WBC, Wet Prep HPF POC: <10 (ref <10)
Yeast Wet Prep HPF POC: NONE SEEN

## 2022-07-26 LAB — HEMOGLOBIN AND HEMATOCRIT, BLOOD
HCT: 38.7 % (ref 36.0–46.0)
Hemoglobin: 13.1 g/dL (ref 12.0–15.0)

## 2022-07-26 LAB — URINALYSIS, ROUTINE W REFLEX MICROSCOPIC
Bilirubin Urine: NEGATIVE
Glucose, UA: NEGATIVE mg/dL
Hgb urine dipstick: NEGATIVE
Ketones, ur: NEGATIVE mg/dL
Leukocytes,Ua: NEGATIVE
Nitrite: NEGATIVE
Protein, ur: NEGATIVE mg/dL
Specific Gravity, Urine: 1.02 (ref 1.005–1.030)
pH: 6 (ref 5.0–8.0)

## 2022-07-26 MED ORDER — TERCONAZOLE 0.4 % VA CREA: 1.0000 | TOPICAL_CREAM | Freq: Every day | VAGINAL | 0 refills | Status: DC

## 2022-07-26 MED ORDER — METRONIDAZOLE 500 MG PO TABS
2000.0000 mg | ORAL_TABLET | Freq: Once | ORAL | Status: AC
  Administered 2022-07-26: 2000 mg via ORAL
  Filled 2022-07-26: qty 4

## 2022-07-26 NOTE — MAU Provider Note (Cosign Needed Addendum)
History     [id number]  Arrival date and time: 07/26/22 1406    Chief Complaint  Patient presents with   Miscarriage     HPI Jamie [name] is a 27 y.o. [id number] at [redacted]w[redacted]d by LMP who presents for vaginal bleeding.  Reports episode of vaginal bleeding 2 days ago. After intercourse reports episode of heavy bleeding while in the shower & passed a clot the sized of a baseball. Bleeding then stopped & has not seen any blood since then. Has had some abdominal cramping after voiding.  Was prescribed treatment for trichomonas last month but states she only took 1 pill and her partner wasn't treated. Has continued to have intercourse. Believes that she still has trichomonas as well as yeast infection.   Has had some episodes of dizziness. States she has been eating several bowls of cereal a day as well as noodles and fruit. Has not had good protein intake.    OB History     Gravida  6   Para  3   Term  3   Preterm  0   AB  2   Living  3      SAB  2   IAB  0   Ectopic  0   Multiple  0   Live Births  3           Past Medical History:  Diagnosis Date   Breast lump    Depression    Hypertension    Pregnancy induced hypertension    UTI (urinary tract infection)     Past Surgical History:  Procedure Laterality Date   NO PAST SURGERIES      Family History  Problem Relation Age of Onset   Hypertension Mother    Healthy Father    Hypertension Maternal Grandmother     Social History   Socioeconomic History   Marital status: Single    Spouse name: Not on file   Number of children: Not on file   Years of education: Not on file   Highest education level: Not on file  Occupational History   Not on file  Tobacco Use   Smoking status: Every Day    Types: Cigars   Smokeless tobacco: Never  Vaping Use   Vaping Use: Former   Substances: Nicotine, THC, Flavoring  Substance and Sexual Activity   Alcohol use: Yes    Comment: once a month    Drug use: Yes    Frequency: 7.0 times per week    Types: Marijuana   Sexual activity: Yes  Other Topics Concern   Not on file  Social History Narrative   Not on file   Social Determinants of Health   Financial Resource Strain: Not on file  Food Insecurity: Not on file  Transportation Needs: Not on file  Physical Activity: Not on file  Stress: Not on file  Social Connections: Not on file  Intimate Partner Violence: Not on file    No Known Allergies  No current facility-administered medications on file prior to encounter.   No current outpatient medications on file prior to encounter.     ROS Pertinent positives and negative per HPI, all others reviewed and negative  Physical Exam   BP 107/63 (BP Location: Right Arm)   Pulse (!) 101   Temp 98.4 F (36.9 C) (Oral)   Resp 18   Ht  (1.753 m)   Wt 59.3 kg   LMP 04/24/2022   SpO2  100%   BMI 19.30 kg/m   Patient Vitals for the past 24 hrs:  BP Temp Temp src Pulse Resp SpO2 Height Weight  07/26/22 1424 107/63 98.4 F (36.9 C) Oral (!) 101 18 100 % 5\' 9"  (1.753 m) 59.3 kg    Physical Exam Vitals and nursing note reviewed.  Constitutional:      General: She is not in acute distress.    Appearance: She is well-developed. She is not ill-appearing.  HENT:     Head: Normocephalic and atraumatic.  Eyes:     General: No scleral icterus.       Right eye: No discharge.        Left eye: No discharge.     Conjunctiva/sclera: Conjunctivae normal.  Pulmonary:     Effort: Pulmonary effort is normal. No respiratory distress.  Neurological:     General: No focal deficit present.     Mental Status: She is alert.  Psychiatric:        Mood and Affect: Mood normal.        Behavior: Behavior normal.       Labs Results for orders placed or performed during the hospital encounter of 07/26/22 (from the past 24 hour(s))  Wet prep, genital     Status: Abnormal   Collection Time: 07/26/22  2:33 PM   Specimen: PATH  Cytology Cervicovaginal Ancillary Only  Result Value Ref Range   Yeast Wet Prep HPF POC NONE SEEN NONE SEEN   Trich, Wet Prep PRESENT (A) NONE SEEN   Clue Cells Wet Prep HPF POC PRESENT (A) NONE SEEN   WBC, Wet Prep HPF POC <10 <10   Sperm NONE SEEN   Urinalysis, Routine w reflex microscopic -Urine, Clean Catch     Status: Abnormal   Collection Time: 07/26/22  2:33 PM  Result Value Ref Range   Color, Urine YELLOW YELLOW   APPearance HAZY (A) CLEAR   Specific Gravity, Urine 1.020 1.005 - 1.030   pH 6.0 5.0 - 8.0   Glucose, UA NEGATIVE NEGATIVE mg/dL   Hgb urine dipstick NEGATIVE NEGATIVE   Bilirubin Urine NEGATIVE NEGATIVE   Ketones, ur NEGATIVE NEGATIVE mg/dL   Protein, ur NEGATIVE NEGATIVE mg/dL   Nitrite NEGATIVE NEGATIVE   Leukocytes,Ua NEGATIVE NEGATIVE  Hemoglobin and hematocrit, blood     Status: None   Collection Time: 07/26/22  3:10 PM  Result Value Ref Range   Hemoglobin 13.1 12.0 - 15.0 g/dL   HCT 16.1 09.6 - 04.5 %    Imaging No results found.  MAU Course  Procedures Lab Orders         Wet prep, genital         Culture, OB Urine         Urinalysis, Routine w reflex microscopic -Urine, Clean Catch         Hemoglobin and hematocrit, blood    Meds ordered this encounter  Medications   metroNIDAZOLE (FLAGYL) tablet 2,000 mg   terconazole (TERAZOL 7) 0.4 % vaginal cream    Sig: Place 1 applicator vaginally at bedtime. Use for seven days    Dispense:  45 g    Refill:  0    Order Specific Question:   Supervising Provider    Answer:   Adam Phenix [[id number]]   Imaging Orders  No imaging studies ordered today    MDM moderate  Assessment and Plan   1. Trichomonal vaginitis in pregnancy in first trimester  -Patient still has trichomonas due  to lack of proper treatment. Prefers one time dosing which I believe to be preferable as she previously did not complete 7 day course. Given 2 gm of flagyl while in MAU. Instructed to have partner goes to [facility] on Monday for  treatment. No intercourse x 2 weeks.  -GC/CT pending  2. PCB (post coital bleeding)  -Episode of bleeding occurred immediately after intercourse in setting of untreated trichomonas. One time episode that has not continued & fetal heart tones present. Per review of previous ultrasound patient did not have a subchorionic hemorrhage. She is RH positive.   3. Fetal heart tones present, first trimester   4. Vaginal yeast infection  -Patient states she couldn't use the cream previously prescribed b/c she couldn't get it to come out of the tube. Feels like she still has yeast & requests new prescription. Rx terazol.   5. [redacted] weeks gestation of pregnancy  -Intermittent dizziness possibly related to sugar/carb heavy diet. Her hemoglobin is normal as are her vital signs. Discussed adding protein to her diet & limit high sugar/processed foods. She has f/u with OB next week.     #FWB: FHT 155 by doppler    Dispo: discharged to home in stable condition.   Discharge Instructions     Discharge patient   Complete by: As directed    Discharge disposition: 01-Home or Self Care   Discharge patient date: 07/26/2022       Judeth Horn, NP 07/26/22 4:27 PM  Allergies as of 07/26/2022   No Known Allergies      Medication List     STOP taking these medications    miconazole 2 % vaginal cream Commonly known as: MONISTAT 7       TAKE these medications    terconazole 0.4 % vaginal cream Commonly known as: TERAZOL 7 Place 1 applicator vaginally at bedtime. Use for seven days

## 2022-07-26 NOTE — MAU Note (Addendum)
Jamie [name] [name] is a 27 y.o. at [redacted]w[redacted]d here in MAU reporting: she had a miscarriage two days ago.  Reports had "a lot"  of VB and passed 1 big blood clot approximately the size of tennis ball.  Reports didn't see any tissue or embryo. States she's no longer bleeding.  Also reports after voiding has abdominal pain. LMP: NA Onset of complaint: 2 days ago Pain score: 0 There were no vitals filed for this visit.   FHT: 155bpm Lab orders placed from triage:   UA

## 2022-07-27 LAB — CULTURE, OB URINE
Culture: 80000 — AB
Special Requests: NORMAL

## 2022-07-28 ENCOUNTER — Other Ambulatory Visit: Payer: Self-pay | Admitting: Obstetrics and Gynecology

## 2022-07-28 LAB — GC/CHLAMYDIA PROBE AMP (~~LOC~~) NOT AT ARMC
Chlamydia: POSITIVE — AB
Comment: NEGATIVE
Comment: NORMAL
Neisseria Gonorrhea: NEGATIVE

## 2022-07-28 MED ORDER — AMOXICILLIN 875 MG PO TABS: 875.0000 mg | ORAL_TABLET | Freq: Two times a day (BID) | ORAL | 0 refills | Status: DC

## 2022-07-28 NOTE — Progress Notes (Signed)
+   urine culture.   Rx for Amox sent per Dr. Adrian Blackwater.  Will recommend f/u urine culture in 4 weeks   Attempted to call the patient 2x and no answer, no option to leave VM.   Duane Lope, NP 07/28/2022 10:39 AM

## 2022-07-29 ENCOUNTER — Telehealth (INDEPENDENT_AMBULATORY_CARE_PROVIDER_SITE_OTHER): Payer: Medicaid Other

## 2022-07-29 DIAGNOSIS — A749 Chlamydial infection, unspecified: Secondary | ICD-10-CM

## 2022-07-29 DIAGNOSIS — Z3480 Encounter for supervision of other normal pregnancy, unspecified trimester: Secondary | ICD-10-CM

## 2022-07-29 MED ORDER — PROMETHAZINE HCL 25 MG PO TABS: 25.0000 mg | ORAL_TABLET | Freq: Four times a day (QID) | ORAL | 1 refills | Status: DC | PRN

## 2022-07-29 MED ORDER — AZITHROMYCIN 250 MG PO TABS: 1000.0000 mg | ORAL_TABLET | Freq: Once | ORAL | 0 refills | Status: AC

## 2022-07-29 MED ORDER — BLOOD PRESSURE MONITORING DEVI: 1.0000 | 0 refills | Status: AC

## 2022-07-29 MED ORDER — PRENATAL PLUS 27-1 MG PO TABS: 1.0000 | ORAL_TABLET | Freq: Every day | ORAL | 11 refills | Status: DC

## 2022-07-29 NOTE — Progress Notes (Signed)
Pt tested positive for Chlamydia, so sent Rx for Azithromycin.

## 2022-07-29 NOTE — Progress Notes (Signed)
New OB Intake  I connected with Jamie [name]  on 07/29/22 at 11:15 AM EDT by MyChart Video Visit and verified that I am speaking with the correct person using two identifiers. Nurse is located at [facility] and pt is located at home.  I discussed the limitations, risks, security and privacy concerns of performing an evaluation and management service by telephone and the availability of in person appointments. I also discussed with the patient that there may be a patient responsible charge related to this service. The patient expressed understanding and agreed to proceed.  I explained I am completing New OB Intake today. We discussed EDD of 01/2423 that is based on LMP of 04/24/22. Pt is G6/P3. I reviewed her allergies, medications, Medical/Surgical/OB history, and appropriate screenings. I informed her of [facility] services. [facility] information placed in AVS. Based on history, this is a low risk pregnancy.  Patient Active Problem List   Diagnosis Date Noted   Supervision of other normal pregnancy, antepartum 07/29/2022   Trichomonal vaginitis in pregnancy in first trimester 06/23/2022   Atypical squamous cells of undetermined significance (ASCUS) on Papanicolaou smear of cervix 03/19/2020   Marijuana use 03/19/2020   Tobacco abuse 03/19/2020   Anxiety 03/19/2020    Concerns addressed today  Delivery Plans Plans to deliver at [facility]. Patient given information for [facility] Healthy Baby website for more information about [facility]. Patient is not interested in water birth. Offered upcoming OB visit with CNM to discuss further.  MyChart/Babyscripts MyChart access verified. I explained pt will have some visits in office and some virtually. Babyscripts instructions given and order placed. Patient verifies receipt of registration text/e-mail. Account successfully created and app downloaded.  Blood Pressure Cuff/Weight Scale Blood pressure cuff ordered for patient to  pick-up from [employer]. Explained after first prenatal appt pt will check weekly and document in Babyscripts. Patient does have weight scale.  Anatomy US Explained first scheduled [place] will be around 19 weeks. Anatomy US scheduled for 09/04/22 at 0945a. Pt notified to arrive at 0930a.  Labs Discussed Avelina Laine genetic screening with patient. Would like both Panorama and Horizon drawn at new OB visit. Routine prenatal labs needed.  COVID Vaccine Patient has had COVID vaccine.   Is patient a CenteringPregnancy candidate?  Accepted       Is patient a Mom+Baby Combined Care candidate?  Not a candidate    Social Determinants of Health Food Insecurity: Patient denies food insecurity. WIC Referral: Patient is interested in referral to [facility].  Transportation: Patient denies transportation needs. Childcare: Discussed no children allowed at ultrasound appointments. Offered childcare services; patient declines childcare services at this time.  Interested in [place]? If yes, send referral and doula dot phrase.   First visit review I reviewed new OB appt with patient. Explained pt will be seen by Dr. Donavan Foil at first visit; encounter routed to appropriate provider. Explained that patient will be seen by pregnancy navigator following visit with provider.   Henrietta Dine, CMA 07/29/2022  11:53 AM

## 2022-08-04 ENCOUNTER — Ambulatory Visit (INDEPENDENT_AMBULATORY_CARE_PROVIDER_SITE_OTHER): Payer: Medicaid Other | Admitting: Obstetrics and Gynecology

## 2022-08-04 VITALS — BP 112/73 | HR 102 | Wt 129.4 lb

## 2022-08-04 DIAGNOSIS — Z3480 Encounter for supervision of other normal pregnancy, unspecified trimester: Secondary | ICD-10-CM

## 2022-08-04 DIAGNOSIS — O23592 Infection of other part of genital tract in pregnancy, second trimester: Secondary | ICD-10-CM | POA: Diagnosis not present

## 2022-08-04 DIAGNOSIS — O98812 Other maternal infectious and parasitic diseases complicating pregnancy, second trimester: Secondary | ICD-10-CM | POA: Diagnosis not present

## 2022-08-04 DIAGNOSIS — O23591 Infection of other part of genital tract in pregnancy, first trimester: Secondary | ICD-10-CM

## 2022-08-04 DIAGNOSIS — Z3A14 14 weeks gestation of pregnancy: Secondary | ICD-10-CM

## 2022-08-04 DIAGNOSIS — A749 Chlamydial infection, unspecified: Secondary | ICD-10-CM

## 2022-08-04 DIAGNOSIS — A5901 Trichomonal vulvovaginitis: Secondary | ICD-10-CM

## 2022-08-04 DIAGNOSIS — O09292 Supervision of pregnancy with other poor reproductive or obstetric history, second trimester: Secondary | ICD-10-CM

## 2022-08-04 DIAGNOSIS — O09299 Supervision of pregnancy with other poor reproductive or obstetric history, unspecified trimester: Secondary | ICD-10-CM

## 2022-08-04 MED ORDER — ASPIRIN 81 MG PO CHEW: 81.0000 mg | CHEWABLE_TABLET | Freq: Every day | ORAL | 7 refills | Status: DC

## 2022-08-04 MED ORDER — AZITHROMYCIN 500 MG PO TABS: 1000.0000 mg | ORAL_TABLET | Freq: Once | ORAL | 0 refills | Status: AC

## 2022-08-04 MED ORDER — PRENATAL PLUS 27-1 MG PO TABS: 1.0000 | ORAL_TABLET | Freq: Every day | ORAL | 11 refills | Status: DC

## 2022-08-04 NOTE — Progress Notes (Signed)
INITIAL PRENATAL VISIT NOTE  Subjective:  Jamie [name] is a 27 y.o. [phone] at [redacted]w[redacted]d by LMP being seen today for her initial prenatal visit. She has an obstetric history significant for SVD x 3 and preeclampsia. She has a medical history significant for hypertension.  Patient reports  intermittent spotting .  Contractions: Not present. Vag. Bleeding: Other (spotting).  Movement: Present. Denies leaking of fluid.    Past Medical History:  Diagnosis Date   Breast lump    Depression    Hypertension    Pregnancy induced hypertension    UTI (urinary tract infection)     Past Surgical History:  Procedure Laterality Date   NO PAST SURGERIES      OB History  Gravida Para Term Preterm AB Living  6 3 3  0 2 3  SAB IAB Ectopic Multiple Live Births  2 0 0 0 3    # Outcome Date GA Lbr Len/2nd Weight Sex Delivery Anes PTL Lv  6 Current           5 Term 03/19/20 [redacted]w[redacted]d 02:27 / 00:03 6 lb 11.1 oz (3.036 kg) M Vag-Spont None  LIV     Birth Comments: abnormal positioning of toes left, right  4 SAB 03/2019 [redacted]w[redacted]d         3 Term 12/07/17 [redacted]w[redacted]d  6 lb (2.722 kg) F Vag-Spont     2 Term 11/04/16 [redacted]w[redacted]d  7 lb (3.175 kg) F Vag-Spont        Complications: Preeclampsia  1 SAB      SAB       Social History   Socioeconomic History   Marital status: Single    Spouse name: Not on file   Number of children: Not on file   Years of education: Not on file   Highest education level: Not on file  Occupational History   Not on file  Tobacco Use   Smoking status: Every Day    Types: Cigars   Smokeless tobacco: Never  Vaping Use   Vaping Use: Former   Substances: Nicotine, THC, Flavoring  Substance and Sexual Activity   Alcohol use: Yes    Comment: once a month   Drug use: Yes    Frequency: 7.0 times per week    Types: Marijuana   Sexual activity: Yes    Birth control/protection: None  Other Topics Concern   Not on file  Social History Narrative   Not on file   Social  Determinants of Health   Financial Resource Strain: Not on file  Food Insecurity: Not on file  Transportation Needs: Not on file  Physical Activity: Not on file  Stress: Not on file  Social Connections: Not on file    Family History  Problem Relation Age of Onset   Hypertension Mother    Healthy Father    Hypertension Maternal Grandmother      Current Outpatient Medications:    amoxicillin (AMOXIL) 875 MG tablet, Take 1 tablet (875 mg total) by mouth 2 (two) times daily., Disp: 14 tablet, Rfl: 0   aspirin 81 MG chewable tablet, Chew 1 tablet (81 mg total) by mouth daily., Disp: 30 tablet, Rfl: 7   azithromycin (ZITHROMAX) 500 MG tablet, Take 2 tablets (1,000 mg total) by mouth once for 1 dose., Disp: 2 tablet, Rfl: 0   promethazine (PHENERGAN) 25 MG tablet, Take 1 tablet (25 mg total) by mouth every 6 (six) hours as needed for nausea or vomiting., Disp: 30 tablet,  Rfl: 1   terconazole (TERAZOL 7) 0.4 % vaginal cream, Place 1 applicator vaginally at bedtime. Use for seven days, Disp: 45 g, Rfl: 0   Blood Pressure Monitoring DEVI, 1 each by Does not apply route once a week. (Patient not taking: Reported on 08/04/2022), Disp: 1 each, Rfl: 0   prenatal vitamin w/FE, FA (PRENATAL 1 + 1) 27-1 MG TABS tablet, Take 1 tablet by mouth daily at 12 noon., Disp: 30 tablet, Rfl: 11  No Known Allergies  Review of Systems: Negative except for what is mentioned in HPI.  Objective:   Vitals:   08/04/22 0956  BP: 112/73  Pulse: (!) 102  Weight: 129 lb 6.4 oz (58.7 kg)    Fetal Status: Fetal Heart Rate (bpm): 153   Movement: Present     Physical Exam: BP 112/73   Pulse (!) 102   Wt 129 lb 6.4 oz (58.7 kg)   LMP 04/24/2022   BMI 19.11 kg/m  CONSTITUTIONAL: Well-developed, well-nourished female in no acute distress.  NEUROLOGIC: Alert and oriented to person, place, and time. Normal reflexes, muscle tone coordination. No cranial nerve deficit noted. PSYCHIATRIC: Normal mood and affect.  Normal behavior. Normal judgment and thought content. SKIN: Skin is warm and dry. No rash noted. Not diaphoretic. No erythema. No pallor. HENT:  Normocephalic, atraumatic, External right and left ear normal. Oropharynx is clear and moist EYES: Conjunctivae and EOM are normal.  NECK: Normal range of motion, supple, no masses CARDIOVASCULAR: Normal heart rate noted, regular rhythm RESPIRATORY: Effort and breath sounds normal, no problems with respiration noted BREASTS: deferred ABDOMEN: Soft, nontender, nondistended, gravid. ZO:[id number] MUSCULOSKELETAL: Normal range of motion. EXT:  No edema and no tenderness. 2+ distal pulses.   Assessment and Plan:  Pregnancy: [id number] at [redacted]w[redacted]d by LMP  1. [redacted] weeks gestation of pregnancy   2. Trichomonal vaginitis in pregnancy in first trimester Pt was treated in [facility], will need TOC in 6-8 weeks  3. Supervision of other normal pregnancy, antepartum Continue routine prenatal care  - CBC/D/Plt+RPR+Rh+ABO+RubIgG... - PANORAMA PRENATAL TEST FULL PANEL - HORIZON Basic Panel - Hemoglobin A1c - prenatal vitamin w/FE, FA (PRENATAL 1 + 1) 27-1 MG TABS tablet; Take 1 tablet by mouth daily at 12 noon.  Dispense: 30 tablet; Refill: 11  4. Hx of preeclampsia, prior pregnancy, currently pregnant Baseline labs and baby ASA  - CMP14+EGFR - Protein / creatinine ratio, urine - aspirin 81 MG chewable tablet; Chew 1 tablet (81 mg total) by mouth daily.  Dispense: 30 tablet; Refill: 7  5. Chlamydia infection affecting pregnancy in second trimester Med has not been picked up.  Will resend to pt's preferred pharmacy  - azithromycin (ZITHROMAX) 500 MG tablet; Take 2 tablets (1,000 mg total) by mouth once for 1 dose.  Dispense: 2 tablet; Refill: 0   Preterm labor symptoms and general obstetric precautions including but not limited to vaginal bleeding, contractions, leaking of fluid and fetal movement were reviewed in detail with the patient.  Please refer to After  Visit Summary for other counseling recommendations.   Return in about 4 weeks (around 09/01/2022) for ROB, in person.  Warden Fillers 08/04/2022 11:00 AM

## 2022-08-05 LAB — CMP14+EGFR
ALT: 20 IU/L (ref 0–32)
AST: 28 IU/L (ref 0–40)
Albumin/Globulin Ratio: 1.5 (ref 1.2–2.2)
Albumin: 3.5 g/dL — ABNORMAL LOW (ref 4.0–5.0)
Alkaline Phosphatase: 58 IU/L (ref 44–121)
BUN/Creatinine Ratio: 10 (ref 9–23)
BUN: 6 mg/dL (ref 6–20)
Bilirubin Total: 0.4 mg/dL (ref 0.0–1.2)
CO2: 18 mmol/L — ABNORMAL LOW (ref 20–29)
Calcium: 8.9 mg/dL (ref 8.7–10.2)
Chloride: 102 mmol/L (ref 96–106)
Creatinine, Ser: 0.58 mg/dL (ref 0.57–1.00)
Globulin, Total: 2.3 g/dL (ref 1.5–4.5)
Glucose: 76 mg/dL (ref 70–99)
Potassium: 4 mmol/L (ref 3.5–5.2)
Sodium: 134 mmol/L (ref 134–144)
Total Protein: 5.8 g/dL — ABNORMAL LOW (ref 6.0–8.5)
eGFR: 130 mL/min/{1.73_m2} (ref >59)

## 2022-08-05 LAB — CBC/D/PLT+RPR+RH+ABO+RUBIGG...
Antibody Screen: NEGATIVE
Basophils Absolute: 0 10*3/uL (ref 0.0–0.2)
Basos: 1 %
EOS (ABSOLUTE): 0 10*3/uL (ref 0.0–0.4)
Eos: 0 %
HCV Ab: NONREACTIVE
HIV Screen 4th Generation wRfx: NONREACTIVE
Hematocrit: 40.3 % (ref 34.0–46.6)
Hemoglobin: 13.5 g/dL (ref 11.1–15.9)
Hepatitis B Surface Ag: NEGATIVE
Immature Grans (Abs): 0 10*3/uL (ref 0.0–0.1)
Immature Granulocytes: 0 %
Lymphocytes Absolute: 1.9 10*3/uL (ref 0.7–3.1)
Lymphs: 34 %
MCH: 31.5 pg (ref 26.6–33.0)
MCHC: 33.5 g/dL (ref 31.5–35.7)
MCV: 94 fL (ref 79–97)
Monocytes Absolute: 0.5 10*3/uL (ref 0.1–0.9)
Monocytes: 8 %
Neutrophils Absolute: 3.1 10*3/uL (ref 1.4–7.0)
Neutrophils: 57 %
Platelets: 238 10*3/uL (ref 150–450)
RBC: 4.29 x10E6/uL (ref 3.77–5.28)
RDW: 12.8 % (ref 11.7–15.4)
RPR Ser Ql: NONREACTIVE
Rh Factor: POSITIVE
Rubella Antibodies, IGG: 1.5 index (ref >0.99)
WBC: 5.4 10*3/uL (ref 3.4–10.8)

## 2022-08-05 LAB — HEMOGLOBIN A1C
Est. average glucose Bld gHb Est-mCnc: 103 mg/dL
Hgb A1c MFr Bld: 5.2 % (ref 4.8–5.6)

## 2022-08-05 LAB — HCV INTERPRETATION

## 2022-08-12 LAB — PANORAMA PRENATAL TEST FULL PANEL:PANORAMA TEST PLUS 5 ADDITIONAL MICRODELETIONS: FETAL FRACTION: 18.4

## 2022-08-13 LAB — HORIZON CUSTOM: REPORT SUMMARY: POSITIVE — AB

## 2022-08-18 ENCOUNTER — Encounter: Payer: Self-pay | Admitting: Obstetrics and Gynecology

## 2022-08-18 ENCOUNTER — Telehealth: Payer: Self-pay | Admitting: Lactation Services

## 2022-08-18 DIAGNOSIS — Z148 Genetic carrier of other disease: Secondary | ICD-10-CM | POA: Insufficient documentation

## 2022-08-18 NOTE — Telephone Encounter (Signed)
-----   Message from Warden Fillers, MD sent at 08/18/2022  9:37 AM EDT ----- Increased risk for SMA noted, can offer genetic counseling

## 2022-08-18 NOTE — Telephone Encounter (Signed)
Called patient to inform of results of [facility]. Patient did not answer. LM for her to check her My Chart message and to call with any questions or concerns as needed.

## 2022-08-20 NOTE — Progress Notes (Signed)
PRENATAL VISIT NOTE  Subjective:  Jamie [name] is a 27 y.o. [phone] at [redacted]w[redacted]d being seen today for ongoing prenatal care.  She is currently monitored for the following issues for this high-risk pregnancy and has Atypical squamous cells of undetermined significance (ASCUS) on Papanicolaou smear of cervix; Marijuana use; Tobacco abuse; Anxiety; Trichomonal vaginitis in pregnancy in first trimester; Supervision of other normal pregnancy, antepartum; Hx of preeclampsia, prior pregnancy, currently pregnant; and Carrier of spinal muscular atrophy on their problem list.  Patient reports fatigue, headache, nausea, and dizziness . Reviewed labs from 4/29. No anemia. Electrolytes essentially nml. No improvement in HA w/ Tylenol. Having Nausea and vomiting. Has Rx but hasn't started it. Contractions: Irritability. Vag. Bleeding: None.  Movement: Present. Denies leaking of fluid.   The following portions of the patient's history were reviewed and updated as appropriate: allergies, current medications, past family history, past medical history, past social history, past surgical history and problem list.   Objective:   Vitals:   08/21/22 1107  BP: 108/75  Pulse: 83  Weight: 128 lb (58.1 kg)    Fetal Status:     Movement: Present     General:  Alert, oriented and cooperative. Patient is in no acute distress.  Skin: Skin is warm and dry. No rash noted.   Cardiovascular: Normal heart rate noted  Respiratory: Normal respiratory effort, no problems with respiration noted  Abdomen: Soft, gravid, appropriate for gestational age.  Pain/Pressure: Present     Pelvic: Cervical exam deferred        Extremities: Normal range of motion.     Mental Status: Normal mood and affect. Normal behavior. Normal judgment and thought content.   Assessment and Plan:  Pregnancy: [id number] at 103w6d 1. Trichomonal vaginitis in pregnancy in first trimester - TOC NV  2. Atypical squamous cells of  undetermined significance (ASCUS) on Papanicolaou smear of cervix - Pap due   3. Supervision of other normal pregnancy, antepartum - Routine CenteringPregnancy care  4. Hx of preeclampsia, prior pregnancy, currently pregnant - on bASA - Nml baseline labs  5. Carrier of spinal muscular atrophy - Partner testing offered. Partner not involved  6. [redacted] weeks gestation of pregnancy -  AFP info given  7. Tobacco abuse - Encourage cessation.   8. Headache - Rx Fioricet  9. Fatigue and dizziness - Encouraged pt to start antiemetic and have small, frequent meals. Consider Cardio-Ob is Sx do not improve.   Centering Pregnancy, Session#1: Introduction to model of care. Group determined rules for self-governance and closing phrase. Oriented group to space and mother's notebook.   Facilitated discussion today:  common discomforts, When to call practice  Mindfulness activity completed as well as introduction to deep breathing for childbirth preparation- Centering 3 Breaths  Fundal height and FHR appropriate today unless noted otherwise in plan of care. Patient to continue group care.    Preterm labor symptoms and general obstetric precautions including but not limited to vaginal bleeding, contractions, leaking of fluid and fetal movement were reviewed in detail with the patient. Please refer to After Visit Summary for other counseling recommendations.   Return in about 4 weeks (around 09/18/2022) for Centering.  Future Appointments  Date Time Provider Department Center  09/04/2022  9:30 AM [facility] NURSE [facility]  09/04/2022  9:45 AM [facility] US6 [facility]  09/18/2022  9:00 AM CENTERING PROVIDER [facility]  10/16/2022  9:00 AM CENTERING PROVIDER [facility]  10/30/2022  9:00 AM CENTERING PROVIDER [facility]  11/13/2022  9:00 AM CENTERING PROVIDER [facility]  11/27/2022  9:00 AM CENTERING PROVIDER [facility]  12/11/2022  9:00 AM CENTERING PROVIDER [facility]  12/25/2022  9:00 AM  CENTERING PROVIDER [facility]  01/08/2023  9:00 AM CENTERING PROVIDER [facility]  01/22/2023  9:00 AM CENTERING PROVIDER [facility]    Dorathy Kinsman, CNM

## 2022-08-21 ENCOUNTER — Ambulatory Visit (INDEPENDENT_AMBULATORY_CARE_PROVIDER_SITE_OTHER): Payer: Medicaid Other | Admitting: Advanced Practice Midwife

## 2022-08-21 VITALS — BP 108/75 | HR 83 | Wt 128.0 lb

## 2022-08-21 DIAGNOSIS — O09292 Supervision of pregnancy with other poor reproductive or obstetric history, second trimester: Secondary | ICD-10-CM

## 2022-08-21 DIAGNOSIS — O23592 Infection of other part of genital tract in pregnancy, second trimester: Secondary | ICD-10-CM

## 2022-08-21 DIAGNOSIS — A5901 Trichomonal vulvovaginitis: Secondary | ICD-10-CM

## 2022-08-21 DIAGNOSIS — Z3480 Encounter for supervision of other normal pregnancy, unspecified trimester: Secondary | ICD-10-CM

## 2022-08-21 DIAGNOSIS — M549 Dorsalgia, unspecified: Secondary | ICD-10-CM

## 2022-08-21 DIAGNOSIS — Z720 Tobacco use: Secondary | ICD-10-CM

## 2022-08-21 DIAGNOSIS — O99891 Other specified diseases and conditions complicating pregnancy: Secondary | ICD-10-CM

## 2022-08-21 DIAGNOSIS — R519 Headache, unspecified: Secondary | ICD-10-CM

## 2022-08-21 DIAGNOSIS — O26892 Other specified pregnancy related conditions, second trimester: Secondary | ICD-10-CM

## 2022-08-21 DIAGNOSIS — R87610 Atypical squamous cells of undetermined significance on cytologic smear of cervix (ASC-US): Secondary | ICD-10-CM

## 2022-08-21 DIAGNOSIS — Z3A17 17 weeks gestation of pregnancy: Secondary | ICD-10-CM

## 2022-08-21 DIAGNOSIS — O09299 Supervision of pregnancy with other poor reproductive or obstetric history, unspecified trimester: Secondary | ICD-10-CM

## 2022-08-21 DIAGNOSIS — Z148 Genetic carrier of other disease: Secondary | ICD-10-CM

## 2022-08-23 LAB — CULTURE, OB URINE

## 2022-08-23 LAB — URINE CULTURE, OB REFLEX

## 2022-08-24 MED ORDER — BUTALBITAL-APAP-CAFFEINE 50-325-40 MG PO TABS: 1.0000 | ORAL_TABLET | Freq: Four times a day (QID) | ORAL | 0 refills | Status: DC | PRN

## 2022-08-27 ENCOUNTER — Encounter: Payer: Self-pay | Admitting: Advanced Practice Midwife

## 2022-09-04 ENCOUNTER — Ambulatory Visit: Payer: Medicaid Other | Attending: Obstetrics and Gynecology

## 2022-09-04 ENCOUNTER — Ambulatory Visit: Payer: Medicaid Other | Admitting: *Deleted

## 2022-09-04 ENCOUNTER — Encounter: Payer: Self-pay | Admitting: *Deleted

## 2022-09-04 VITALS — BP 101/68 | HR 108

## 2022-09-04 DIAGNOSIS — O99332 Smoking (tobacco) complicating pregnancy, second trimester: Secondary | ICD-10-CM | POA: Diagnosis not present

## 2022-09-04 DIAGNOSIS — Z3A19 19 weeks gestation of pregnancy: Secondary | ICD-10-CM | POA: Diagnosis not present

## 2022-09-04 DIAGNOSIS — Z3480 Encounter for supervision of other normal pregnancy, unspecified trimester: Secondary | ICD-10-CM | POA: Diagnosis not present

## 2022-09-04 DIAGNOSIS — Z363 Encounter for antenatal screening for malformations: Secondary | ICD-10-CM | POA: Insufficient documentation

## 2022-09-04 DIAGNOSIS — Z148 Genetic carrier of other disease: Secondary | ICD-10-CM | POA: Diagnosis not present

## 2022-09-04 DIAGNOSIS — O09292 Supervision of pregnancy with other poor reproductive or obstetric history, second trimester: Secondary | ICD-10-CM | POA: Insufficient documentation

## 2022-09-18 ENCOUNTER — Encounter: Payer: Medicaid Other | Admitting: Advanced Practice Midwife

## 2022-09-18 DIAGNOSIS — A5901 Trichomonal vulvovaginitis: Secondary | ICD-10-CM

## 2022-09-18 DIAGNOSIS — Z720 Tobacco use: Secondary | ICD-10-CM

## 2022-09-18 DIAGNOSIS — Z3480 Encounter for supervision of other normal pregnancy, unspecified trimester: Secondary | ICD-10-CM

## 2022-09-18 DIAGNOSIS — O09299 Supervision of pregnancy with other poor reproductive or obstetric history, unspecified trimester: Secondary | ICD-10-CM

## 2022-09-18 DIAGNOSIS — Z148 Genetic carrier of other disease: Secondary | ICD-10-CM

## 2022-09-18 DIAGNOSIS — Z3A21 21 weeks gestation of pregnancy: Secondary | ICD-10-CM

## 2022-09-18 DIAGNOSIS — R87610 Atypical squamous cells of undetermined significance on cytologic smear of cervix (ASC-US): Secondary | ICD-10-CM

## 2022-09-18 DIAGNOSIS — F1290 Cannabis use, unspecified, uncomplicated: Secondary | ICD-10-CM

## 2022-09-29 ENCOUNTER — Telehealth: Payer: Medicaid Other | Admitting: Family Medicine

## 2022-09-29 DIAGNOSIS — Z91199 Patient's noncompliance with other medical treatment and regimen due to unspecified reason: Secondary | ICD-10-CM

## 2022-09-29 NOTE — Progress Notes (Signed)
Appears patient joined early, but did not wait for provider, and never returned.   The patient no-showed for appointment despite this provider sending direct link, reaching out via phone with no response and waiting for at least 10 minutes from appointment time for patient to join. They will be marked as a NS for this appointment/time.   Freddy Finner, NP

## 2022-09-30 ENCOUNTER — Ambulatory Visit: Payer: Medicaid Other

## 2022-10-04 ENCOUNTER — Ambulatory Visit
Admission: EM | Admit: 2022-10-04 | Discharge: 2022-10-04 | Disposition: A | Discharge status: Home / Self Care | Payer: Medicaid Other

## 2022-10-05 ENCOUNTER — Ambulatory Visit: Payer: Medicaid Other

## 2022-10-06 ENCOUNTER — Ambulatory Visit (INDEPENDENT_AMBULATORY_CARE_PROVIDER_SITE_OTHER): Payer: Medicaid Other | Admitting: *Deleted

## 2022-10-06 VITALS — BP 97/58 | HR 96 | Ht 69.0 in | Wt 132.0 lb

## 2022-10-06 DIAGNOSIS — Z3A00 Weeks of gestation of pregnancy not specified: Secondary | ICD-10-CM

## 2022-10-06 DIAGNOSIS — R350 Frequency of micturition: Secondary | ICD-10-CM

## 2022-10-06 DIAGNOSIS — O26899 Other specified pregnancy related conditions, unspecified trimester: Secondary | ICD-10-CM

## 2022-10-06 LAB — POCT URINALYSIS DIP (DEVICE)
Bilirubin Urine: NEGATIVE
Glucose, UA: NEGATIVE mg/dL
Ketones, ur: NEGATIVE mg/dL
Nitrite: NEGATIVE
Protein, ur: NEGATIVE mg/dL
Specific Gravity, Urine: >=1.03 (ref 1.005–1.030)
Urobilinogen, UA: 0.2 mg/dL (ref 0.0–1.0)
pH: 6 (ref 5.0–8.0)

## 2022-10-06 MED ORDER — NITROFURANTOIN MONOHYD MACRO 100 MG PO CAPS
100.0000 mg | ORAL_CAPSULE | Freq: Two times a day (BID) | ORAL | 0 refills | Status: DC
Start: 2022-10-06 — End: 2022-11-13

## 2022-10-06 NOTE — Progress Notes (Signed)
Pt presents with c/o urinary frequency and concern for UTI. She also has back pain. Per chart review, pt was treated previously on 07/28/22 for UTI however states she did not complete the prescription. POC UA completed which shows small Leukocytes. Urine culture ordered. Rx for Macrobid given per standing order. Pt advised she will be notified if alternate Rx is required following culture results. Pt also reports vaginal itching and believes it is yeast. She stated that she only used previous Rx of Terazol for 2 nights because there was a hole in the tube of medication and it was coming out the side. I advised pt to tape the tube and use as directed for 7 nights. If still having issues to please contact office for new Rx. Pt voiced understanding of all information and instructions given.

## 2022-10-07 ENCOUNTER — Other Ambulatory Visit: Payer: Self-pay | Admitting: *Deleted

## 2022-10-07 DIAGNOSIS — Z362 Encounter for other antenatal screening follow-up: Secondary | ICD-10-CM

## 2022-10-10 LAB — URINE CULTURE, OB REFLEX

## 2022-10-10 LAB — CULTURE, OB URINE

## 2022-10-13 ENCOUNTER — Ambulatory Visit: Payer: Medicaid Other

## 2022-10-13 ENCOUNTER — Ambulatory Visit: Payer: Medicaid Other | Attending: Obstetrics and Gynecology

## 2022-10-16 ENCOUNTER — Encounter: Payer: Self-pay | Admitting: Advanced Practice Midwife

## 2022-10-30 ENCOUNTER — Encounter: Payer: Medicaid Other | Admitting: Advanced Practice Midwife

## 2022-10-30 ENCOUNTER — Encounter: Payer: Self-pay | Admitting: *Deleted

## 2022-11-06 ENCOUNTER — Other Ambulatory Visit: Payer: Self-pay

## 2022-11-06 DIAGNOSIS — Z3A28 28 weeks gestation of pregnancy: Secondary | ICD-10-CM

## 2022-11-07 ENCOUNTER — Other Ambulatory Visit: Payer: Medicaid Other

## 2022-11-13 ENCOUNTER — Other Ambulatory Visit: Payer: Self-pay | Admitting: Obstetrics and Gynecology

## 2022-11-13 ENCOUNTER — Other Ambulatory Visit (HOSPITAL_COMMUNITY)
Admission: RE | Admit: 2022-11-13 | Discharge: 2022-11-13 | Disposition: A | Discharge status: Home / Self Care | Payer: Medicaid Other | Source: Ambulatory Visit | Attending: Advanced Practice Midwife | Admitting: Advanced Practice Midwife

## 2022-11-13 ENCOUNTER — Other Ambulatory Visit: Payer: Medicaid Other

## 2022-11-13 ENCOUNTER — Other Ambulatory Visit: Payer: Self-pay

## 2022-11-13 ENCOUNTER — Ambulatory Visit (HOSPITAL_BASED_OUTPATIENT_CLINIC_OR_DEPARTMENT_OTHER): Payer: Medicaid Other

## 2022-11-13 ENCOUNTER — Other Ambulatory Visit: Payer: Self-pay | Admitting: *Deleted

## 2022-11-13 ENCOUNTER — Encounter: Payer: Self-pay | Admitting: Advanced Practice Midwife

## 2022-11-13 ENCOUNTER — Ambulatory Visit: Payer: Medicaid Other

## 2022-11-13 ENCOUNTER — Ambulatory Visit: Payer: Medicaid Other | Attending: Obstetrics and Gynecology | Admitting: *Deleted

## 2022-11-13 ENCOUNTER — Ambulatory Visit: Payer: Medicaid Other | Admitting: Advanced Practice Midwife

## 2022-11-13 VITALS — BP 105/73 | HR 96 | Wt 136.2 lb

## 2022-11-13 VITALS — BP 118/69 | HR 96

## 2022-11-13 DIAGNOSIS — O23591 Infection of other part of genital tract in pregnancy, first trimester: Secondary | ICD-10-CM | POA: Insufficient documentation

## 2022-11-13 DIAGNOSIS — Z3A29 29 weeks gestation of pregnancy: Secondary | ICD-10-CM

## 2022-11-13 DIAGNOSIS — A5901 Trichomonal vulvovaginitis: Secondary | ICD-10-CM

## 2022-11-13 DIAGNOSIS — O285 Abnormal chromosomal and genetic finding on antenatal screening of mother: Secondary | ICD-10-CM

## 2022-11-13 DIAGNOSIS — Z148 Genetic carrier of other disease: Secondary | ICD-10-CM

## 2022-11-13 DIAGNOSIS — O98813 Other maternal infectious and parasitic diseases complicating pregnancy, third trimester: Secondary | ICD-10-CM | POA: Insufficient documentation

## 2022-11-13 DIAGNOSIS — Z23 Encounter for immunization: Secondary | ICD-10-CM

## 2022-11-13 DIAGNOSIS — Z3A28 28 weeks gestation of pregnancy: Secondary | ICD-10-CM

## 2022-11-13 DIAGNOSIS — O0933 Supervision of pregnancy with insufficient antenatal care, third trimester: Secondary | ICD-10-CM

## 2022-11-13 DIAGNOSIS — O09299 Supervision of pregnancy with other poor reproductive or obstetric history, unspecified trimester: Secondary | ICD-10-CM

## 2022-11-13 DIAGNOSIS — O365930 Maternal care for other known or suspected poor fetal growth, third trimester, not applicable or unspecified: Secondary | ICD-10-CM

## 2022-11-13 DIAGNOSIS — B3731 Acute candidiasis of vulva and vagina: Secondary | ICD-10-CM | POA: Diagnosis not present

## 2022-11-13 DIAGNOSIS — O98213 Gonorrhea complicating pregnancy, third trimester: Secondary | ICD-10-CM

## 2022-11-13 DIAGNOSIS — F1290 Cannabis use, unspecified, uncomplicated: Secondary | ICD-10-CM | POA: Diagnosis not present

## 2022-11-13 DIAGNOSIS — O2613 Low weight gain in pregnancy, third trimester: Secondary | ICD-10-CM

## 2022-11-13 DIAGNOSIS — Z3480 Encounter for supervision of other normal pregnancy, unspecified trimester: Secondary | ICD-10-CM

## 2022-11-13 DIAGNOSIS — N898 Other specified noninflammatory disorders of vagina: Secondary | ICD-10-CM | POA: Diagnosis not present

## 2022-11-13 DIAGNOSIS — Z8619 Personal history of other infectious and parasitic diseases: Secondary | ICD-10-CM | POA: Insufficient documentation

## 2022-11-13 DIAGNOSIS — O98211 Gonorrhea complicating pregnancy, first trimester: Secondary | ICD-10-CM | POA: Insufficient documentation

## 2022-11-13 DIAGNOSIS — O26893 Other specified pregnancy related conditions, third trimester: Secondary | ICD-10-CM | POA: Diagnosis not present

## 2022-11-13 DIAGNOSIS — O99323 Drug use complicating pregnancy, third trimester: Secondary | ICD-10-CM

## 2022-11-13 DIAGNOSIS — O09293 Supervision of pregnancy with other poor reproductive or obstetric history, third trimester: Secondary | ICD-10-CM

## 2022-11-13 DIAGNOSIS — Z362 Encounter for other antenatal screening follow-up: Secondary | ICD-10-CM

## 2022-11-13 DIAGNOSIS — O23593 Infection of other part of genital tract in pregnancy, third trimester: Secondary | ICD-10-CM

## 2022-11-13 DIAGNOSIS — O365990 Maternal care for other known or suspected poor fetal growth, unspecified trimester, not applicable or unspecified: Secondary | ICD-10-CM | POA: Insufficient documentation

## 2022-11-13 HISTORY — DX: Maternal care for other known or suspected poor fetal growth, unspecified trimester, not applicable or unspecified: O36.5990

## 2022-11-13 NOTE — Progress Notes (Signed)
PRENATAL VISIT NOTE  Subjective:  Jamie [name] is a 27 y.o. [phone] at [redacted]w[redacted]d being seen today for ongoing prenatal care.  She is currently monitored for the following issues for this high-risk pregnancy and has Atypical squamous cells of undetermined significance (ASCUS) on Papanicolaou smear of cervix; Marijuana use; Tobacco abuse; Anxiety; Trichomonal vaginitis in pregnancy in first trimester; Supervision of other normal pregnancy, antepartum; Hx of preeclampsia, prior pregnancy, currently pregnant; and Carrier of spinal muscular atrophy on their problem list.  Patient reports nausea.  Contractions: Irritability. Vag. Bleeding: None.  Movement: Present. Denies leaking of fluid.   The following portions of the patient's history were reviewed and updated as appropriate: allergies, current medications, past family history, past medical history, past social history, past surgical history and problem list.   Objective:   Vitals:   11/13/22 1113  BP: 105/73  Pulse: 96  Weight: 136 lb 3.2 oz (61.8 kg)    Fetal Status: Fetal Heart Rate (bpm): 140 Fundal Height: 28 cm Movement: Present     General:  Alert, oriented and cooperative. Patient is in no acute distress.  Skin: Skin is warm and dry. No rash noted.   Cardiovascular: Normal heart rate noted  Respiratory: Normal respiratory effort, no problems with respiration noted  Abdomen: Soft, gravid, appropriate for gestational age.  Pain/Pressure: Present     Pelvic: Cervical exam deferred        Extremities: Normal range of motion.  Edema: None  Mental Status: Normal mood and affect. Normal behavior. Normal judgment and thought content.   Assessment and Plan:  Pregnancy: [id number] at [redacted]w[redacted]d 1. Gonorrhea affecting pregnancy in first trimester - TOC - Cervicovaginal ancillary only  2. Supervision of other normal pregnancy, antepartum - Strongly encourage DTaP to come to prenatal visits. Offered one-on-one if preferred.  Plans to continue Centering.  - F/U US this afternoon - TDaP - 28 weeks labs  3. Trichomonal vaginitis in pregnancy in first trimester  - Cervicovaginal ancillary only  4. Vaginal discharge during pregnancy in third trimester  - Cervicovaginal ancillary only  5. [redacted] weeks gestation of pregnancy   6. Need for diphtheria-tetanus-pertussis (Tdap) vaccine  - Tdap vaccine greater than or equal to 7yo IM  7. Poor weight gain of pregnancy, third --improving - Growth [place] this afternoon - Pt reports NV much better but nauseas from GTT today.   8. Limited prenatal care in third trimester - Encouraged pt to complete care.  - Pt is connected with pregnancy navigator [place] who has been helping address barriers to care.   Preterm labor symptoms and general obstetric precautions including but not limited to vaginal bleeding, contractions, leaking of fluid and fetal movement were reviewed in detail with the patient. Please refer to After Visit Summary for other counseling recommendations.    Centering Pregnancy, Session#5: Reviewed resources in [employer].   Facilitated discussion today:   - Signs and risk factors of preterm labor, emphasizing modifiable risk factors.  - Encouraged to choose pediatrician.  - TDaP - Addressed sleep challenges, positions  Fundal height and FHR appropriate today unless noted otherwise in plan. Patient to continue group care.   Return in about 2 weeks (around 11/27/2022) for Centering as scheduled.  Future Appointments  Date Time Provider Department Center  11/27/2022  9:00 AM CENTERING PROVIDER [facility]  11/28/2022  7:30 AM [facility] NURSE [facility]  11/28/2022  7:45 AM [facility] US4 [facility]  11/28/2022  9:45 AM [facility] NST [facility]  12/05/2022  9:30 AM [facility] NURSE [facility]  12/05/2022  9:45 AM [facility] US4 [facility]  12/11/2022  9:00 AM CENTERING PROVIDER [facility]  12/12/2022  9:45 AM [facility] NURSE [facility]  12/12/2022 10:00 AM  [facility] US1 [facility]  12/25/2022  9:00 AM CENTERING PROVIDER [facility]  01/08/2023  9:00 AM CENTERING PROVIDER [facility]  01/22/2023  9:00 AM CENTERING PROVIDER [facility]    Dorathy Kinsman, CNM

## 2022-11-17 MED ORDER — TERCONAZOLE 0.4 % VA CREA
1.0000 | TOPICAL_CREAM | Freq: Every day | VAGINAL | 2 refills | Status: DC
Start: 2022-11-17 — End: 2022-12-18

## 2022-11-17 NOTE — Addendum Note (Signed)
Addended by: Dorathy Kinsman on: 11/17/2022 11:33 AM   Modules accepted: Orders

## 2022-11-27 ENCOUNTER — Ambulatory Visit (INDEPENDENT_AMBULATORY_CARE_PROVIDER_SITE_OTHER): Payer: Medicaid Other | Admitting: Advanced Practice Midwife

## 2022-11-27 VITALS — BP 113/75 | HR 89 | Wt 142.0 lb

## 2022-11-27 DIAGNOSIS — O2340 Unspecified infection of urinary tract in pregnancy, unspecified trimester: Secondary | ICD-10-CM

## 2022-11-27 DIAGNOSIS — O23591 Infection of other part of genital tract in pregnancy, first trimester: Secondary | ICD-10-CM

## 2022-11-27 DIAGNOSIS — O09299 Supervision of pregnancy with other poor reproductive or obstetric history, unspecified trimester: Secondary | ICD-10-CM

## 2022-11-27 DIAGNOSIS — Z3A31 31 weeks gestation of pregnancy: Secondary | ICD-10-CM

## 2022-11-27 DIAGNOSIS — Z3480 Encounter for supervision of other normal pregnancy, unspecified trimester: Secondary | ICD-10-CM

## 2022-11-27 DIAGNOSIS — O365990 Maternal care for other known or suspected poor fetal growth, unspecified trimester, not applicable or unspecified: Secondary | ICD-10-CM

## 2022-11-27 DIAGNOSIS — A5901 Trichomonal vulvovaginitis: Secondary | ICD-10-CM

## 2022-11-28 ENCOUNTER — Ambulatory Visit (HOSPITAL_BASED_OUTPATIENT_CLINIC_OR_DEPARTMENT_OTHER): Payer: Medicaid Other | Admitting: *Deleted

## 2022-11-28 ENCOUNTER — Ambulatory Visit: Payer: Medicaid Other | Admitting: *Deleted

## 2022-11-28 ENCOUNTER — Ambulatory Visit: Payer: Medicaid Other

## 2022-11-28 VITALS — BP 117/67 | HR 72

## 2022-11-28 DIAGNOSIS — F1290 Cannabis use, unspecified, uncomplicated: Secondary | ICD-10-CM

## 2022-11-28 DIAGNOSIS — Z3A31 31 weeks gestation of pregnancy: Secondary | ICD-10-CM | POA: Insufficient documentation

## 2022-11-28 DIAGNOSIS — O09299 Supervision of pregnancy with other poor reproductive or obstetric history, unspecified trimester: Secondary | ICD-10-CM | POA: Insufficient documentation

## 2022-11-28 DIAGNOSIS — O365930 Maternal care for other known or suspected poor fetal growth, third trimester, not applicable or unspecified: Secondary | ICD-10-CM | POA: Diagnosis present

## 2022-11-28 DIAGNOSIS — O09293 Supervision of pregnancy with other poor reproductive or obstetric history, third trimester: Secondary | ICD-10-CM

## 2022-11-28 DIAGNOSIS — O99323 Drug use complicating pregnancy, third trimester: Secondary | ICD-10-CM | POA: Diagnosis not present

## 2022-11-28 DIAGNOSIS — O285 Abnormal chromosomal and genetic finding on antenatal screening of mother: Secondary | ICD-10-CM

## 2022-11-28 DIAGNOSIS — Z148 Genetic carrier of other disease: Secondary | ICD-10-CM

## 2022-11-28 NOTE — Procedures (Signed)
Jamie [name] Jamie [name] [birth year] [redacted]w[redacted]d  Fetus A Non-Stress Test Interpretation for 11/28/22  Indication: IUGR  Fetal Heart Rate A Mode: External Baseline Rate (A): 130 bpm Variability: Moderate Accelerations: 15 x 15 Decelerations: None  Uterine Activity Mode: Toco Contraction Frequency (min): None Resting Tone Palpated: Relaxed  Interpretation (Fetal Testing) Nonstress Test Interpretation: Reactive Overall Impression: Reassuring for gestational age Comments: Tracing reviewed by Dr. Parke Poisson

## 2022-11-30 LAB — CULTURE, OB URINE

## 2022-11-30 LAB — URINE CULTURE, OB REFLEX

## 2022-12-01 DIAGNOSIS — O2340 Unspecified infection of urinary tract in pregnancy, unspecified trimester: Secondary | ICD-10-CM

## 2022-12-01 HISTORY — DX: Unspecified infection of urinary tract in pregnancy, unspecified trimester: O23.40

## 2022-12-01 NOTE — Progress Notes (Addendum)
   PRENATAL VISIT NOTE  Subjective:  Jamie [name] is a 27 y.o. [phone] at [redacted]w[redacted]d being seen today for ongoing prenatal care.  She is currently monitored for the following issues for this high-risk pregnancy and has Atypical squamous cells of undetermined significance (ASCUS) on Papanicolaou smear of cervix; Marijuana use; Tobacco abuse; Anxiety; Trichomonal vaginitis in pregnancy in first trimester; Supervision of other normal pregnancy, antepartum; Hx of preeclampsia, prior pregnancy, currently pregnant; Carrier of spinal muscular atrophy; Fetal growth restriction antepartum; Limited prenatal care in third trimester; and UTI (urinary tract infection) during pregnancy on their problem list.  Patient reports no complaints.  Contractions: Not present. Vag. Bleeding: None.  Movement: Present. Denies leaking of fluid.   The following portions of the patient's history were reviewed and updated as appropriate: allergies, current medications, past family history, past medical history, past social history, past surgical history and problem list.   Objective:   Vitals:   11/27/22 0931  BP: 113/75  Pulse: 89  Weight: 142 lb (64.4 kg)    Fetal Status: Fetal Heart Rate (bpm): 126 Fundal Height: 29 cm Movement: Present  Presentation: Vertex  General:  Alert, oriented and cooperative. Patient is in no acute distress.  Skin: Skin is warm and dry. No rash noted.   Cardiovascular: Normal heart rate noted  Respiratory: Normal respiratory effort, no problems with respiration noted  Abdomen: Soft, gravid, appropriate for gestational age.  Pain/Pressure: Present     Pelvic: Cervical exam deferred        Extremities: Normal range of motion.  Edema: None  Mental Status: Normal mood and affect. Normal behavior. Normal judgment and thought content.   Assessment and Plan:  Pregnancy: [id number] at [redacted]w[redacted]d 1. Urinary tract infection in mother during pregnancy, antepartum - Culture, OB Urine  2.  Hx of preeclampsia, prior pregnancy, currently pregnant - BP Nml. Precautions reviewed  3. Fetal growth restriction antepartum - Antenatal testing per MFM - Gained 6 lb since last appt. Able to eat well. Discussed importance of adequate weight gain.   4. Supervision of other normal pregnancy, antepartum - CenteringPregnancy  5. Trichomonal vaginitis in pregnancy in first trimester - TOC neg  6. [redacted] weeks gestation of pregnancy   Centering Pregnancy, Session#6: Reviewed resources in [employer].  Facilitated discussion today:  - Coping strategies in labor - Preparing for birth and baby: Choosing pediatrician, car seat - [facility] website    My body knows what to do affirmation   Fundal height and FHR appropriate today unless noted otherwise in plan. Patient to continue group care.    Preterm labor symptoms and general obstetric precautions including but not limited to vaginal bleeding, contractions, leaking of fluid and fetal movement were reviewed in detail with the patient. Please refer to After Visit Summary for other counseling recommendations.   Return in about 2 weeks (around 12/11/2022) for Centering as scheduled.  Future Appointments  Date Time Provider Department Center  12/05/2022  9:30 AM [facility] NURSE [facility]  12/05/2022  9:45 AM [facility] US4 [facility]  12/11/2022  9:00 AM CENTERING PROVIDER [facility]  12/12/2022  9:45 AM [facility] NURSE [facility]  12/12/2022 10:00 AM [facility] US1 [facility]  12/25/2022  9:00 AM CENTERING PROVIDER [facility]  01/08/2023  9:00 AM CENTERING PROVIDER [facility]  01/22/2023  9:00 AM CENTERING PROVIDER [facility]    Dorathy Kinsman, CNM

## 2022-12-05 ENCOUNTER — Ambulatory Visit: Payer: Medicaid Other | Attending: Obstetrics and Gynecology

## 2022-12-05 ENCOUNTER — Ambulatory Visit: Payer: Medicaid Other

## 2022-12-11 ENCOUNTER — Encounter: Payer: Medicaid Other | Admitting: Advanced Practice Midwife

## 2022-12-11 DIAGNOSIS — A5901 Trichomonal vulvovaginitis: Secondary | ICD-10-CM

## 2022-12-11 DIAGNOSIS — F1290 Cannabis use, unspecified, uncomplicated: Secondary | ICD-10-CM

## 2022-12-11 DIAGNOSIS — Z3480 Encounter for supervision of other normal pregnancy, unspecified trimester: Secondary | ICD-10-CM

## 2022-12-11 DIAGNOSIS — Z148 Genetic carrier of other disease: Secondary | ICD-10-CM

## 2022-12-11 DIAGNOSIS — O09299 Supervision of pregnancy with other poor reproductive or obstetric history, unspecified trimester: Secondary | ICD-10-CM

## 2022-12-11 DIAGNOSIS — O2341 Unspecified infection of urinary tract in pregnancy, first trimester: Secondary | ICD-10-CM

## 2022-12-11 DIAGNOSIS — Z3A33 33 weeks gestation of pregnancy: Secondary | ICD-10-CM

## 2022-12-12 ENCOUNTER — Other Ambulatory Visit: Payer: Self-pay | Admitting: *Deleted

## 2022-12-12 ENCOUNTER — Ambulatory Visit: Payer: Medicaid Other | Attending: Obstetrics and Gynecology

## 2022-12-12 ENCOUNTER — Ambulatory Visit: Payer: Medicaid Other | Admitting: *Deleted

## 2022-12-12 VITALS — BP 106/65 | HR 80

## 2022-12-12 DIAGNOSIS — O365930 Maternal care for other known or suspected poor fetal growth, third trimester, not applicable or unspecified: Secondary | ICD-10-CM | POA: Insufficient documentation

## 2022-12-12 DIAGNOSIS — O09293 Supervision of pregnancy with other poor reproductive or obstetric history, third trimester: Secondary | ICD-10-CM

## 2022-12-12 DIAGNOSIS — O365990 Maternal care for other known or suspected poor fetal growth, unspecified trimester, not applicable or unspecified: Secondary | ICD-10-CM | POA: Insufficient documentation

## 2022-12-12 DIAGNOSIS — F1290 Cannabis use, unspecified, uncomplicated: Secondary | ICD-10-CM | POA: Diagnosis not present

## 2022-12-12 DIAGNOSIS — O99323 Drug use complicating pregnancy, third trimester: Secondary | ICD-10-CM | POA: Diagnosis not present

## 2022-12-12 DIAGNOSIS — Z148 Genetic carrier of other disease: Secondary | ICD-10-CM

## 2022-12-12 DIAGNOSIS — Z3A33 33 weeks gestation of pregnancy: Secondary | ICD-10-CM

## 2022-12-12 DIAGNOSIS — O285 Abnormal chromosomal and genetic finding on antenatal screening of mother: Secondary | ICD-10-CM

## 2022-12-16 ENCOUNTER — Encounter: Payer: Self-pay | Admitting: *Deleted

## 2022-12-18 ENCOUNTER — Ambulatory Visit: Payer: Medicaid Other | Admitting: *Deleted

## 2022-12-18 ENCOUNTER — Ambulatory Visit: Payer: Medicaid Other

## 2022-12-18 ENCOUNTER — Ambulatory Visit: Payer: Medicaid Other | Attending: Obstetrics and Gynecology

## 2022-12-18 ENCOUNTER — Other Ambulatory Visit: Payer: Self-pay | Admitting: Obstetrics and Gynecology

## 2022-12-18 VITALS — BP 120/78 | HR 100

## 2022-12-18 DIAGNOSIS — Z148 Genetic carrier of other disease: Secondary | ICD-10-CM

## 2022-12-18 DIAGNOSIS — O365930 Maternal care for other known or suspected poor fetal growth, third trimester, not applicable or unspecified: Secondary | ICD-10-CM | POA: Insufficient documentation

## 2022-12-18 DIAGNOSIS — O99323 Drug use complicating pregnancy, third trimester: Secondary | ICD-10-CM

## 2022-12-18 DIAGNOSIS — O09293 Supervision of pregnancy with other poor reproductive or obstetric history, third trimester: Secondary | ICD-10-CM

## 2022-12-18 DIAGNOSIS — F1290 Cannabis use, unspecified, uncomplicated: Secondary | ICD-10-CM

## 2022-12-18 DIAGNOSIS — O285 Abnormal chromosomal and genetic finding on antenatal screening of mother: Secondary | ICD-10-CM

## 2022-12-18 DIAGNOSIS — Z3A34 34 weeks gestation of pregnancy: Secondary | ICD-10-CM

## 2022-12-24 ENCOUNTER — Ambulatory Visit (HOSPITAL_BASED_OUTPATIENT_CLINIC_OR_DEPARTMENT_OTHER): Payer: Medicaid Other | Admitting: *Deleted

## 2022-12-24 ENCOUNTER — Ambulatory Visit: Payer: Medicaid Other

## 2022-12-24 ENCOUNTER — Other Ambulatory Visit: Payer: Self-pay | Admitting: Obstetrics and Gynecology

## 2022-12-24 ENCOUNTER — Ambulatory Visit: Payer: Medicaid Other | Admitting: *Deleted

## 2022-12-24 ENCOUNTER — Ambulatory Visit: Payer: Medicaid Other | Attending: Obstetrics and Gynecology

## 2022-12-24 VITALS — BP 119/63 | HR 81

## 2022-12-24 DIAGNOSIS — O365930 Maternal care for other known or suspected poor fetal growth, third trimester, not applicable or unspecified: Secondary | ICD-10-CM

## 2022-12-24 DIAGNOSIS — O365990 Maternal care for other known or suspected poor fetal growth, unspecified trimester, not applicable or unspecified: Secondary | ICD-10-CM | POA: Diagnosis present

## 2022-12-24 DIAGNOSIS — Z3A34 34 weeks gestation of pregnancy: Secondary | ICD-10-CM | POA: Diagnosis present

## 2022-12-24 DIAGNOSIS — O99323 Drug use complicating pregnancy, third trimester: Secondary | ICD-10-CM

## 2022-12-24 DIAGNOSIS — O285 Abnormal chromosomal and genetic finding on antenatal screening of mother: Secondary | ICD-10-CM

## 2022-12-24 DIAGNOSIS — O09293 Supervision of pregnancy with other poor reproductive or obstetric history, third trimester: Secondary | ICD-10-CM

## 2022-12-24 DIAGNOSIS — Z148 Genetic carrier of other disease: Secondary | ICD-10-CM

## 2022-12-24 DIAGNOSIS — F1290 Cannabis use, unspecified, uncomplicated: Secondary | ICD-10-CM | POA: Diagnosis not present

## 2022-12-24 NOTE — Procedures (Signed)
Jamie [name] Jamie [name] [birth year] 105w6d  Fetus A Non-Stress Test Interpretation for 12/24/22   NST with BPP  Indication: IUGR  Fetal Heart Rate A Mode: External Baseline Rate (A): 140 bpm Variability: Moderate Accelerations: 15 x 15 Decelerations: None Multiple birth?: No  Uterine Activity Mode: Palpation, Toco Contraction Frequency (min): none Resting Tone Palpated: Relaxed  Interpretation (Fetal Testing) Nonstress Test Interpretation: Reactive Overall Impression: Reassuring for gestational age Comments: Dr. Parke Poisson reviewed tracing

## 2022-12-25 ENCOUNTER — Encounter: Payer: Medicaid Other | Admitting: Advanced Practice Midwife

## 2022-12-29 ENCOUNTER — Encounter: Payer: Self-pay | Admitting: Obstetrics and Gynecology

## 2023-01-01 ENCOUNTER — Other Ambulatory Visit: Payer: Self-pay | Admitting: *Deleted

## 2023-01-01 ENCOUNTER — Ambulatory Visit: Payer: Medicaid Other | Admitting: *Deleted

## 2023-01-01 ENCOUNTER — Ambulatory Visit: Payer: Medicaid Other | Attending: Obstetrics and Gynecology

## 2023-01-01 VITALS — BP 111/70 | HR 81

## 2023-01-01 DIAGNOSIS — O09293 Supervision of pregnancy with other poor reproductive or obstetric history, third trimester: Secondary | ICD-10-CM | POA: Diagnosis not present

## 2023-01-01 DIAGNOSIS — Z148 Genetic carrier of other disease: Secondary | ICD-10-CM

## 2023-01-01 DIAGNOSIS — F1290 Cannabis use, unspecified, uncomplicated: Secondary | ICD-10-CM | POA: Diagnosis not present

## 2023-01-01 DIAGNOSIS — Z3A36 36 weeks gestation of pregnancy: Secondary | ICD-10-CM

## 2023-01-01 DIAGNOSIS — O365930 Maternal care for other known or suspected poor fetal growth, third trimester, not applicable or unspecified: Secondary | ICD-10-CM

## 2023-01-01 DIAGNOSIS — O365990 Maternal care for other known or suspected poor fetal growth, unspecified trimester, not applicable or unspecified: Secondary | ICD-10-CM

## 2023-01-01 DIAGNOSIS — O99323 Drug use complicating pregnancy, third trimester: Secondary | ICD-10-CM | POA: Diagnosis not present

## 2023-01-01 NOTE — Procedures (Signed)
Jamie [name] Jamie [name] 24-Aug-1995 [redacted]w[redacted]d  Fetus A Non-Stress Test Interpretation for 01/01/23-NST with UAD  Indication: IUGR  Fetal Heart Rate A Mode: External Baseline Rate (A): 130 bpm Variability: Moderate Accelerations: 15 x 15 Decelerations: None Multiple birth?: No  Uterine Activity Mode: Toco Contraction Frequency (min): none Resting Tone Palpated: Relaxed  Interpretation (Fetal Testing) Nonstress Test Interpretation: Reactive Comments: Tracing reviewed bydr. Grace Bushy

## 2023-01-08 ENCOUNTER — Ambulatory Visit: Payer: Medicaid Other | Admitting: Advanced Practice Midwife

## 2023-01-08 ENCOUNTER — Other Ambulatory Visit (HOSPITAL_COMMUNITY)
Admission: RE | Admit: 2023-01-08 | Discharge: 2023-01-08 | Disposition: A | Discharge status: Home / Self Care | Payer: Medicaid Other | Source: Ambulatory Visit | Attending: Advanced Practice Midwife | Admitting: Advanced Practice Midwife

## 2023-01-08 VITALS — BP 121/79 | HR 86 | Wt 146.2 lb

## 2023-01-08 DIAGNOSIS — O365930 Maternal care for other known or suspected poor fetal growth, third trimester, not applicable or unspecified: Secondary | ICD-10-CM

## 2023-01-08 DIAGNOSIS — O09293 Supervision of pregnancy with other poor reproductive or obstetric history, third trimester: Secondary | ICD-10-CM

## 2023-01-08 DIAGNOSIS — Z3480 Encounter for supervision of other normal pregnancy, unspecified trimester: Secondary | ICD-10-CM | POA: Insufficient documentation

## 2023-01-08 DIAGNOSIS — Z3A37 37 weeks gestation of pregnancy: Secondary | ICD-10-CM

## 2023-01-08 DIAGNOSIS — K5900 Constipation, unspecified: Secondary | ICD-10-CM

## 2023-01-08 DIAGNOSIS — O09299 Supervision of pregnancy with other poor reproductive or obstetric history, unspecified trimester: Secondary | ICD-10-CM

## 2023-01-08 DIAGNOSIS — O365990 Maternal care for other known or suspected poor fetal growth, unspecified trimester, not applicable or unspecified: Secondary | ICD-10-CM

## 2023-01-08 DIAGNOSIS — O23593 Infection of other part of genital tract in pregnancy, third trimester: Secondary | ICD-10-CM

## 2023-01-08 DIAGNOSIS — A5901 Trichomonal vulvovaginitis: Secondary | ICD-10-CM

## 2023-01-08 DIAGNOSIS — O99613 Diseases of the digestive system complicating pregnancy, third trimester: Secondary | ICD-10-CM

## 2023-01-08 MED ORDER — POLYETHYLENE GLYCOL 3350 17 GM/SCOOP PO POWD
17.0000 g | Freq: Every day | ORAL | 2 refills | Status: DC
Start: 2023-01-08 — End: 2023-01-14

## 2023-01-08 NOTE — Progress Notes (Signed)
   PRENATAL VISIT NOTE  Subjective:  Jamie [name] is a 27 y.o. [phone] at [redacted]w[redacted]d being seen today for ongoing prenatal care.  She is currently monitored for the following issues for this high-risk pregnancy and has Atypical squamous cells of undetermined significance (ASCUS) on Papanicolaou smear of cervix; Tobacco abuse; Trichomonal vaginitis in pregnancy in first trimester; Supervision of other normal pregnancy, antepartum; Hx of preeclampsia, prior pregnancy, currently pregnant; Carrier of spinal muscular atrophy; Fetal growth restriction antepartum; and Limited prenatal care in third trimester on their problem list.  Patient reports no contractions and no leaking.  Contractions: Not present.  .  Movement: Present. Denies leaking of fluid.   The following portions of the patient's history were reviewed and updated as appropriate: allergies, current medications, past family history, past medical history, past social history, past surgical history and problem list.   Objective:   Vitals:   01/08/23 0948  BP: 121/79  Pulse: 86  Weight: 146 lb 3.2 oz (66.3 kg)    Fetal Status: Fetal Heart Rate (bpm): 137 Fundal Height: 34 cm Movement: Present  Presentation: Vertex  General:  Alert, oriented and cooperative. Patient is in no acute distress.  Skin: Skin is warm and dry. No rash noted.   Cardiovascular: Normal heart rate noted  Respiratory: Normal respiratory effort, no problems with respiration noted  Abdomen: Soft, gravid, appropriate for gestational age.  Pain/Pressure: Present     Pelvic: Cervical exam deferred       Self swabbed for GBS and GC/Chlamydia  Extremities: Normal range of motion.  Edema: None  Mental Status: Normal mood and affect. Normal behavior. Normal judgment and thought content.   Assessment and Plan:  Pregnancy: [id number] at 110w0d 1. Supervision of other normal pregnancy, antepartum  - GC/Chlamydia probe amp ([place])not at [facility] - Culture,  beta strep (group b only)  2. Trichomonal vaginitis in pregnancy in first trimester - TOC neg  3. Hx of preeclampsia, prior pregnancy, currently pregnant - BP Nml  4. Fetal growth restriction antepartum - IOL scheduled 10/6 MN. Orders placed.   5. [redacted] weeks gestation of pregnancy   Centering Pregnancy, Session#9: Reviewed resources in [employer].   Facilitated discussion today:   - Newborn care, safety and soothing techniques for infants - Perinatal mood and anxiety disorder activities; Agree/disagree, continuum  - Mindfulness activity with relaxation breathing, cat/cow back stretch  Fundal height and FHR appropriate today unless noted otherwise in plan. Patient to continue group care.   Term labor symptoms and general obstetric precautions including but not limited to vaginal bleeding, contractions, leaking of fluid and fetal movement were reviewed in detail with the patient. Please refer to After Visit Summary for other counseling recommendations.   No follow-ups on file.  Future Appointments  Date Time Provider Department Center  01/09/2023 10:30 AM [facility] US6 [facility]  01/09/2023  1:15 PM [facility] NST [facility]  01/11/2023 12:00 AM MC-LD SCHED ROOM [facility]-INDC None    Dorathy Kinsman, CNM

## 2023-01-08 NOTE — Addendum Note (Signed)
Addended by: Dorathy Kinsman on: 01/08/2023 04:53 PM   Modules accepted: Orders

## 2023-01-09 ENCOUNTER — Ambulatory Visit: Payer: Medicaid Other | Admitting: *Deleted

## 2023-01-09 ENCOUNTER — Encounter: Payer: Self-pay | Admitting: Advanced Practice Midwife

## 2023-01-09 ENCOUNTER — Ambulatory Visit: Payer: Medicaid Other | Attending: Maternal & Fetal Medicine

## 2023-01-09 ENCOUNTER — Telehealth (HOSPITAL_COMMUNITY): Payer: Self-pay | Admitting: *Deleted

## 2023-01-09 ENCOUNTER — Encounter (HOSPITAL_COMMUNITY): Payer: Self-pay | Admitting: *Deleted

## 2023-01-09 DIAGNOSIS — Z3A37 37 weeks gestation of pregnancy: Secondary | ICD-10-CM | POA: Diagnosis present

## 2023-01-09 DIAGNOSIS — O365930 Maternal care for other known or suspected poor fetal growth, third trimester, not applicable or unspecified: Secondary | ICD-10-CM | POA: Diagnosis not present

## 2023-01-09 DIAGNOSIS — O99323 Drug use complicating pregnancy, third trimester: Secondary | ICD-10-CM | POA: Diagnosis not present

## 2023-01-09 DIAGNOSIS — O0933 Supervision of pregnancy with insufficient antenatal care, third trimester: Secondary | ICD-10-CM

## 2023-01-09 DIAGNOSIS — O365990 Maternal care for other known or suspected poor fetal growth, unspecified trimester, not applicable or unspecified: Secondary | ICD-10-CM | POA: Insufficient documentation

## 2023-01-09 DIAGNOSIS — O09293 Supervision of pregnancy with other poor reproductive or obstetric history, third trimester: Secondary | ICD-10-CM

## 2023-01-09 DIAGNOSIS — F1290 Cannabis use, unspecified, uncomplicated: Secondary | ICD-10-CM | POA: Diagnosis not present

## 2023-01-09 DIAGNOSIS — Z148 Genetic carrier of other disease: Secondary | ICD-10-CM

## 2023-01-09 DIAGNOSIS — O285 Abnormal chromosomal and genetic finding on antenatal screening of mother: Secondary | ICD-10-CM

## 2023-01-09 NOTE — Telephone Encounter (Signed)
Preadmission screen  

## 2023-01-09 NOTE — Procedures (Signed)
Maurene Capes Magdelyn Roebuck 1996/01/04 [redacted]w[redacted]d  Fetus A Non-Stress Test Interpretation for 01/09/23- NST with BPP  Indication: IUGR  Fetal Heart Rate A Mode: External Baseline Rate (A): 140 bpm Variability: Moderate Accelerations: 15 x 15 Decelerations: None Multiple birth?: No  Uterine Activity Mode: Toco Contraction Frequency (min): 1 uc during NST Contraction Duration (sec): 3 min Contraction Quality: Mild  Interpretation (Fetal Testing) Nonstress Test Interpretation: Reactive Comments: Tracing reviewed byDr. Darra Lis

## 2023-01-11 ENCOUNTER — Inpatient Hospital Stay (HOSPITAL_COMMUNITY)
Admission: RE | Admit: 2023-01-11 | Discharge: 2023-01-14 | DRG: 806 | Disposition: A | Discharge status: Home / Self Care | Payer: Medicaid Other | Attending: Obstetrics and Gynecology | Admitting: Obstetrics and Gynecology

## 2023-01-11 ENCOUNTER — Inpatient Hospital Stay (HOSPITAL_COMMUNITY): Payer: Medicaid Other

## 2023-01-11 ENCOUNTER — Encounter (HOSPITAL_COMMUNITY): Payer: Self-pay | Admitting: Family Medicine

## 2023-01-11 ENCOUNTER — Encounter (HOSPITAL_COMMUNITY): Payer: Self-pay | Admitting: Obstetrics and Gynecology

## 2023-01-11 ENCOUNTER — Inpatient Hospital Stay (HOSPITAL_COMMUNITY)
Admission: AD | Admit: 2023-01-11 | Discharge: 2023-01-11 | Disposition: A | Discharge status: Home / Self Care | Payer: Medicaid Other | Attending: Obstetrics and Gynecology | Admitting: Obstetrics and Gynecology

## 2023-01-11 DIAGNOSIS — O99891 Other specified diseases and conditions complicating pregnancy: Secondary | ICD-10-CM | POA: Diagnosis present

## 2023-01-11 DIAGNOSIS — O9832 Other infections with a predominantly sexual mode of transmission complicating childbirth: Secondary | ICD-10-CM | POA: Diagnosis present

## 2023-01-11 DIAGNOSIS — Z87891 Personal history of nicotine dependence: Secondary | ICD-10-CM | POA: Diagnosis not present

## 2023-01-11 DIAGNOSIS — A568 Sexually transmitted chlamydial infection of other sites: Secondary | ICD-10-CM | POA: Diagnosis present

## 2023-01-11 DIAGNOSIS — O99324 Drug use complicating childbirth: Secondary | ICD-10-CM | POA: Diagnosis not present

## 2023-01-11 DIAGNOSIS — Z8249 Family history of ischemic heart disease and other diseases of the circulatory system: Secondary | ICD-10-CM

## 2023-01-11 DIAGNOSIS — Z148 Genetic carrier of other disease: Secondary | ICD-10-CM

## 2023-01-11 DIAGNOSIS — Z5982 Transportation insecurity: Secondary | ICD-10-CM | POA: Diagnosis not present

## 2023-01-11 DIAGNOSIS — Z5941 Food insecurity: Secondary | ICD-10-CM

## 2023-01-11 DIAGNOSIS — O98813 Other maternal infectious and parasitic diseases complicating pregnancy, third trimester: Secondary | ICD-10-CM | POA: Diagnosis not present

## 2023-01-11 DIAGNOSIS — Z3A37 37 weeks gestation of pregnancy: Secondary | ICD-10-CM | POA: Insufficient documentation

## 2023-01-11 DIAGNOSIS — O4693 Antepartum hemorrhage, unspecified, third trimester: Secondary | ICD-10-CM | POA: Diagnosis present

## 2023-01-11 DIAGNOSIS — O365930 Maternal care for other known or suspected poor fetal growth, third trimester, not applicable or unspecified: Secondary | ICD-10-CM | POA: Diagnosis not present

## 2023-01-11 DIAGNOSIS — O365990 Maternal care for other known or suspected poor fetal growth, unspecified trimester, not applicable or unspecified: Principal | ICD-10-CM | POA: Diagnosis present

## 2023-01-11 DIAGNOSIS — A749 Chlamydial infection, unspecified: Secondary | ICD-10-CM | POA: Diagnosis not present

## 2023-01-11 DIAGNOSIS — K5900 Constipation, unspecified: Secondary | ICD-10-CM

## 2023-01-11 DIAGNOSIS — R300 Dysuria: Secondary | ICD-10-CM | POA: Diagnosis present

## 2023-01-11 DIAGNOSIS — N771 Vaginitis, vulvitis and vulvovaginitis in diseases classified elsewhere: Secondary | ICD-10-CM | POA: Insufficient documentation

## 2023-01-11 DIAGNOSIS — Z3689 Encounter for other specified antenatal screening: Secondary | ICD-10-CM

## 2023-01-11 DIAGNOSIS — O99334 Smoking (tobacco) complicating childbirth: Secondary | ICD-10-CM | POA: Diagnosis not present

## 2023-01-11 DIAGNOSIS — O0933 Supervision of pregnancy with insufficient antenatal care, third trimester: Secondary | ICD-10-CM | POA: Diagnosis not present

## 2023-01-11 LAB — CBC
HCT: 35.9 % — ABNORMAL LOW (ref 36.0–46.0)
Hemoglobin: 11.9 g/dL — ABNORMAL LOW (ref 12.0–15.0)
MCH: 29.2 pg (ref 26.0–34.0)
MCHC: 33.1 g/dL (ref 30.0–36.0)
MCV: 88.2 fL (ref 80.0–100.0)
Platelets: 264 10*3/uL (ref 150–400)
RBC: 4.07 MIL/uL (ref 3.87–5.11)
RDW: 13.1 % (ref 11.5–15.5)
WBC: 8.3 10*3/uL (ref 4.0–10.5)
nRBC: 0 % (ref 0.0–0.2)

## 2023-01-11 LAB — URINALYSIS, ROUTINE W REFLEX MICROSCOPIC
Bilirubin Urine: NEGATIVE
Glucose, UA: NEGATIVE mg/dL
Hgb urine dipstick: NEGATIVE
Ketones, ur: NEGATIVE mg/dL
Leukocytes,Ua: NEGATIVE
Nitrite: NEGATIVE
Protein, ur: NEGATIVE mg/dL
Specific Gravity, Urine: 1.011 (ref 1.005–1.030)
pH: 6 (ref 5.0–8.0)

## 2023-01-11 LAB — CULTURE, BETA STREP (GROUP B ONLY): Strep Gp B Culture: NEGATIVE

## 2023-01-11 LAB — TYPE AND SCREEN
ABO/RH(D): A POS
Antibody Screen: NEGATIVE

## 2023-01-11 MED ORDER — OXYTOCIN BOLUS FROM INFUSION
333.0000 mL | Freq: Once | INTRAVENOUS | Status: AC
  Administered 2023-01-12: 333 mL via INTRAVENOUS

## 2023-01-11 MED ORDER — MISOPROSTOL 25 MCG QUARTER TABLET: 25.0000 ug | ORAL_TABLET | ORAL | Status: DC

## 2023-01-11 MED ORDER — FLEET ENEMA RE ENEM: 1.0000 | ENEMA | RECTAL | Status: DC | PRN

## 2023-01-11 MED ORDER — OXYCODONE-ACETAMINOPHEN 5-325 MG PO TABS: 1.0000 | ORAL_TABLET | ORAL | Status: DC | PRN

## 2023-01-11 MED ORDER — LACTATED RINGERS IV SOLN: INTRAVENOUS | Status: DC

## 2023-01-11 MED ORDER — TERBUTALINE SULFATE 1 MG/ML IJ SOLN: 0.2500 mg | Freq: Once | INTRAMUSCULAR | Status: DC | PRN

## 2023-01-11 MED ORDER — FENTANYL CITRATE (PF) 100 MCG/2ML IJ SOLN
100.0000 ug | INTRAMUSCULAR | Status: DC | PRN
  Administered 2023-01-12: 100 ug via INTRAVENOUS
  Filled 2023-01-11: qty 2

## 2023-01-11 MED ORDER — ACETAMINOPHEN 325 MG PO TABS: 650.0000 mg | ORAL_TABLET | ORAL | Status: DC | PRN

## 2023-01-11 MED ORDER — MISOPROSTOL 50MCG HALF TABLET
50.0000 ug | ORAL_TABLET | Freq: Once | ORAL | Status: AC
  Administered 2023-01-11: 50 ug via ORAL
  Filled 2023-01-11: qty 1

## 2023-01-11 MED ORDER — OXYTOCIN-SODIUM CHLORIDE 30-0.9 UT/500ML-% IV SOLN
2.5000 [IU]/h | INTRAVENOUS | Status: DC
  Filled 2023-01-11: qty 500

## 2023-01-11 MED ORDER — OXYCODONE-ACETAMINOPHEN 5-325 MG PO TABS: 2.0000 | ORAL_TABLET | ORAL | Status: DC | PRN

## 2023-01-11 MED ORDER — LIDOCAINE HCL (PF) 1 % IJ SOLN: 30.0000 mL | INTRAMUSCULAR | Status: DC | PRN

## 2023-01-11 MED ORDER — SOD CITRATE-CITRIC ACID 500-334 MG/5ML PO SOLN: 30.0000 mL | ORAL | Status: DC | PRN

## 2023-01-11 MED ORDER — AZITHROMYCIN 250 MG PO TABS
1000.0000 mg | ORAL_TABLET | Freq: Once | ORAL | Status: AC
  Administered 2023-01-11: 1000 mg via ORAL
  Filled 2023-01-11: qty 4

## 2023-01-11 MED ORDER — LACTATED RINGERS IV SOLN: 500.0000 mL | INTRAVENOUS | Status: DC | PRN

## 2023-01-11 MED ORDER — ONDANSETRON HCL 4 MG/2ML IJ SOLN: 4.0000 mg | Freq: Four times a day (QID) | INTRAMUSCULAR | Status: DC | PRN

## 2023-01-11 NOTE — H&P (Signed)
OBSTETRIC ADMISSION HISTORY AND PHYSICAL  Jamie [name] is a 27 y.o. female [phone] with IUP at [redacted]w[redacted]d (dated by LMP c/w 5 wk scan, Estimated Date of Delivery: 01/29/23) presenting for IOL 2/2 FGR. She reports +FMs, No LOF, no VB, no blurry vision, headaches or peripheral edema, and RUQ pain.  She plans on breast and formula feeding. She request Depo for birth control. She received her prenatal care at [facility]   Prenatal History/Complications:  - FGR -- EFW 10%, AC 1.9% at 36+0 - Tobacco use - Trichomoniasis s/p TOC  - Chlamydia (tx last week) - h/o pre-eclampsia in first pregnancy - Limited prenatal care in third trimester  Past Medical History: Past Medical History:  Diagnosis Date   Anxiety 03/19/2020   Breast lump    Depression    Fetal growth restriction antepartum 11/13/2022   Hypertension    Marijuana use 03/19/2020   Pregnancy induced hypertension    UTI (urinary tract infection)    UTI (urinary tract infection) during pregnancy 12/01/2022   TOC neg      Past Surgical History: Past Surgical History:  Procedure Laterality Date   NO PAST SURGERIES      Obstetrical History: OB History     Gravida  7   Para  3   Term  3   Preterm  0   AB  2   Living  3      SAB  2   IAB  0   Ectopic  0   Multiple  0   Live Births  3           Social History Social History   Socioeconomic History   Marital status: Single    Spouse name: Not on file   Number of children: Not on file   Years of education: Not on file   Highest education level: Not on file  Occupational History   Not on file  Tobacco Use   Smoking status: Former    Types: Cigars   Smokeless tobacco: Never  Vaping Use   Vaping status: Former   Substances: Nicotine, THC, Flavoring  Substance and Sexual Activity   Alcohol use: Not Currently    Comment: once a month   Drug use: Not Currently    Frequency: 7.0 times per week    Types: Marijuana    Comment: LAST USED29  Aug 2024   Sexual activity: Not Currently    Birth control/protection: None    Comment: 01/11/23  Other Topics Concern   Not on file  Social History Narrative   Not on file   Social Determinants of Health   Financial Resource Strain: Not on file  Food Insecurity: Food Insecurity Present (01/11/2023)   Hunger Vital Sign    Worried About Programme researcher, broadcasting/film/video in the Last Year: Often true    Ran Out of Food in the Last Year: Often true  Transportation Needs: Unmet Transportation Needs (01/11/2023)   PRAPARE - Administrator, Civil Service (Medical): Yes    Lack of Transportation (Non-Medical): Yes  Physical Activity: Not on file  Stress: Not on file  Social Connections: Not on file    Family History: Family History  Problem Relation Age of Onset   Hypertension Mother    Healthy Father    Hypertension Maternal Grandmother     Allergies: No Known Allergies  Medications Prior to Admission  Medication Sig Dispense Refill Last Dose   Blood Pressure Monitoring DEVI 1  each by Does not apply route once a week. (Patient not taking: Reported on 01/01/2023) 1 each 0    polyethylene glycol powder (GLYCOLAX/MIRALAX) 17 GM/SCOOP powder Take 17 g by mouth daily. 500 g 2      Review of Systems  All systems reviewed and negative except as stated in HPI.  Blood pressure 112/76, pulse 98, height 5\' 9"  (1.753 m), weight 66.5 kg, last menstrual period 04/24/2022, unknown if currently breastfeeding. General appearance: alert and cooperative Lungs: breathing comfortably Heart: regular rate Abdomen: soft, non-tender, gravid Pelvic: as below Extremities: no edema of bilateral lower extremities Presentation: cephalic Fetal monitoring: 135/mod/+a/-d Uterine activity: Infrequent Dilation: 2 Effacement (%): 20 Station: -3 Exam by:: Dr. Lucianne Muss   Prenatal labs: ABO, Rh: --/--/PENDING (10/06 2155) Antibody: PENDING (10/06 2155) Rubella: 1.50 (04/29 1207) RPR: Non Reactive (08/08  1056)  HBsAg: Negative (04/29 1207)  HIV: Non Reactive (08/08 1056)  GBS: Negative/-- (10/03 0130)  2 hr Glucola wnl Genetic screening  LR, female Anatomy US - normal  Last [place]: At [redacted]w[redacted]d - cephalic presentation, EFW 2362 (10 %tile), AC 1.9%  Prenatal Transfer Tool  Maternal Diabetes: No Genetic Screening: Normal Maternal Ultrasounds/Referrals: FGR Fetal Ultrasounds or other Referrals:  Referred to Materal Fetal Medicine  Maternal Substance Abuse:  Yes:  Type: Smoker, Marijuana Significant Maternal Medications:  None Significant Maternal Lab Results:  Group B Strep negative Number of Prenatal Visits:greater than 3 verified prenatal visits Other Comments:  None  Results for orders placed or performed during the hospital encounter of 01/11/23 (from the past 24 hour(s))  CBC   Collection Time: 01/11/23  9:55 PM  Result Value Ref Range   WBC 8.3 4.0 - 10.5 K/uL   RBC 4.07 3.87 - 5.11 MIL/uL   Hemoglobin 11.9 (L) 12.0 - 15.0 g/dL   HCT 16.1 (L) 09.6 - 04.5 %   MCV 88.2 80.0 - 100.0 fL   MCH 29.2 26.0 - 34.0 pg   MCHC 33.1 30.0 - 36.0 g/dL   RDW 40.9 81.1 - 91.4 %   Platelets 264 150 - 400 K/uL   nRBC 0.0 0.0 - 0.2 %  Type and screen   Collection Time: 01/11/23  9:55 PM  Result Value Ref Range   ABO/RH(D) PENDING    Antibody Screen PENDING    Sample Expiration      01/14/2023,2359 Performed at [facility], [address] [place], [place] [address]   Results for orders placed or performed during the hospital encounter of 01/11/23 (from the past 24 hour(s))  Urinalysis, Routine w reflex microscopic -Urine, Clean Catch   Collection Time: 01/11/23  1:15 AM  Result Value Ref Range   Color, Urine YELLOW YELLOW   APPearance CLEAR CLEAR   Specific Gravity, Urine 1.011 1.005 - 1.030   pH 6.0 5.0 - 8.0   Glucose, UA NEGATIVE NEGATIVE mg/dL   Hgb urine dipstick NEGATIVE NEGATIVE   Bilirubin Urine NEGATIVE NEGATIVE   Ketones, ur NEGATIVE NEGATIVE mg/dL   Protein, ur  NEGATIVE NEGATIVE mg/dL   Nitrite NEGATIVE NEGATIVE   Leukocytes,Ua NEGATIVE NEGATIVE    Patient Active Problem List   Diagnosis Date Noted   Fetal growth restriction antepartum 11/13/2022   Limited prenatal care in third trimester 11/13/2022   Carrier of spinal muscular atrophy 08/18/2022   Hx of preeclampsia, prior pregnancy, currently pregnant 08/04/2022   Supervision of other normal pregnancy, antepartum 07/29/2022   Trichomonal vaginitis in pregnancy in first trimester 06/23/2022   Atypical squamous cells  of undetermined significance (ASCUS) on Papanicolaou smear of cervix 03/19/2020   Tobacco abuse 03/19/2020    Assessment/Plan:  Jamie [name] is a 27 y.o. [phone] at [redacted]w[redacted]d here for IOL 2/2 FGR  #Labor: Discussed process of IOL in detail with pt - she declines FB at this time, opting for Cytotec  will provide only PO Cytotec in view of FGR  plan for recheck around 0200 and consider AROM and starting Pitocin at that time pending progress #Pain: Per pt request #FWB: Cat I #ID:  GBS neg #MOF: breast + formula #MOC:Depo #Circ:  N/a  #Chlamydia: Positive on 01/08/23  was Rx'ed Azithromycin during MAU eval this AM, took Rx  d/w pt recommendation for Erythromycin eye ointment for infant at birth  #H/o pre-e: BP ok this pregnancy  cont to follow BP closely -- if elevated BP, will check CBC, CMP, PCR  #Limited PNC in third trimester  [name], MD OB Fellow, Faculty Practice [facility], Center for [facility] 01/11/2023 10:51 PM

## 2023-01-11 NOTE — MAU Provider Note (Signed)
History     CSN: [id number]  Arrival date and time: 01/11/23 0035   None     Chief Complaint  Patient presents with   Vaginal Bleeding    spotting    Jamie [name] is a 27 y.o. G7 P3023 at [redacted]w[redacted]d who receives care at [facility]; [facility] [address].  She presents today for vaginal spotting and STD treatment. She states she had some spotting today with urination/wiping that was red to pink in color.  She denies abdominal cramping or contractions. She reports having "pain in my cooter" when she moves. She endorses fetal movement.  She states she also has burning with urination and white discharge that is without odor or irritation.   OB History     Gravida  7   Para  3   Term  3   Preterm  0   AB  2   Living  3      SAB  2   IAB  0   Ectopic  0   Multiple  0   Live Births  3           Past Medical History:  Diagnosis Date   Anxiety 03/19/2020   Breast lump    Depression    Fetal growth restriction antepartum 11/13/2022   Hypertension    Marijuana use 03/19/2020   Pregnancy induced hypertension    UTI (urinary tract infection)    UTI (urinary tract infection) during pregnancy 12/01/2022   TOC neg      Past Surgical History:  Procedure Laterality Date   NO PAST SURGERIES      Family History  Problem Relation Age of Onset   Hypertension Mother    Healthy Father    Hypertension Maternal Grandmother     Social History   Tobacco Use   Smoking status: Former    Types: Cigars   Smokeless tobacco: Never  Vaping Use   Vaping status: Former   Substances: Nicotine, THC, Flavoring  Substance Use Topics   Alcohol use: Not Currently    Comment: once a month   Drug use: Not Currently    Frequency: 7.0 times per week    Types: Marijuana    Comment: LAST USED29 Aug 2024    Allergies: No Known Allergies  Medications Prior to Admission  Medication Sig Dispense Refill Last Dose   Blood Pressure Monitoring DEVI 1 each by Does not  apply route once a week. (Patient not taking: Reported on 01/01/2023) 1 each 0    polyethylene glycol powder (GLYCOLAX/MIRALAX) 17 GM/SCOOP powder Take 17 g by mouth daily. 500 g 2     Review of Systems  Eyes:  Negative for visual disturbance.  Gastrointestinal:  Negative for abdominal pain, nausea and vomiting.  Genitourinary:  Positive for dysuria, frequency (More than expected for pregnancy), vaginal bleeding (Spotting with wiping; Red to pink) and vaginal discharge (Creamy white. No irritation or odor.). Negative for difficulty urinating and urgency.  Neurological:  Negative for dizziness, light-headedness and headaches.   Physical Exam   Blood pressure 108/85, pulse 84, temperature 98.3 F (36.8 C), temperature source Oral, resp. rate 19, height 5\' 9"  (1.753 m), weight 66.6 kg, last menstrual period 04/24/2022, SpO2 100%, unknown if currently breastfeeding.  Physical Exam Vitals reviewed. Exam conducted with a chaperone present Darien Ramus, Charity fundraiser).  Constitutional:      Appearance: Normal appearance.  HENT:     Head: Normocephalic and atraumatic.  Eyes:     Conjunctiva/sclera: Conjunctivae  normal.  Cardiovascular:     Rate and Rhythm: Normal rate.  Pulmonary:     Effort: Pulmonary effort is normal. No respiratory distress.  Genitourinary:      Comments: Speculum Exam: -Overall Normal External Genitalia: Non tender, moderate amt white discharge at introitus. Right labia with small 5mm fluid filled cyst noted. Nontender. -Vaginal Vault: Pink mucosa with good rugae. Moderate amt thin white discharge. -Cervix:Pink, no lesions, cysts, or polyps.  Appears closed. No active bleeding from os -Bimanual Exam:  Deferred   Musculoskeletal:        General: Normal range of motion.     Cervical back: Normal range of motion.  Skin:    General: Skin is warm and dry.  Neurological:     Mental Status: She is alert and oriented to person, place, and time.  Psychiatric:        Mood and Affect: Mood  normal.        Behavior: Behavior normal.     Fetal Assessment 135 bpm, Mod Var, -Decels, +15x15 Accels Toco: Irregular  MAU Course   Results for orders placed or performed during the hospital encounter of 01/11/23 (from the past 24 hour(s))  Urinalysis, Routine w reflex microscopic -Urine, Clean Catch     Status: None   Collection Time: 01/11/23  1:15 AM  Result Value Ref Range   Color, Urine YELLOW YELLOW   APPearance CLEAR CLEAR   Specific Gravity, Urine 1.011 1.005 - 1.030   pH 6.0 5.0 - 8.0   Glucose, UA NEGATIVE NEGATIVE mg/dL   Hgb urine dipstick NEGATIVE NEGATIVE   Bilirubin Urine NEGATIVE NEGATIVE   Ketones, ur NEGATIVE NEGATIVE mg/dL   Protein, ur NEGATIVE NEGATIVE mg/dL   Nitrite NEGATIVE NEGATIVE   Leukocytes,Ua NEGATIVE NEGATIVE   No results found.  MDM PE Labs: UA EFM CT Treatment Assessment and Plan  27 year old [id number]  SIUP at 37.3 weeks Cat I FT Vaginal Spotting-Resolved Dysuria  -POC Reviewed -Exam performed and findings discussed. -Patient informed of negative UA findings. -Pelvic exam completed and findings reviewed. -Informed that labial appears somewhat swollen and irritated which may contribute to bleeding.  -Zithromax given for CT. -Patient offered and accepts treatment for SO.  -Script for [employer] DOB 03/06/2008 given for EPT. -NST reactive. -Informed that will monitor shortly while contacting labor floor for potential admission for IOL.  Cherre Robins MSN, CNM 01/11/2023, 1:47 AM   Reassessment (2:24 AM) -NST remains reactive.  -Informed that no beds available right now for induction. -Patient informed that labor team will call when it is time for her to come in. -Patient comments that she will not come. -Provider explains to patient that it is her right to refuse care.  -Patient without questions. -Precautions to be reviewed by nurse. -Encouraged to call primary office or return to MAU if symptoms worsen or with the  onset of new symptoms. -Discharged to home in stable condition.  Cherre Robins MSN, CNM Advanced Practice Provider, Center for [employer]

## 2023-01-11 NOTE — Progress Notes (Signed)
Will need help with transportation home

## 2023-01-11 NOTE — MAU Note (Addendum)
.  Jamie [name] Jamie [name] is a 27 y.o. at [redacted]w[redacted]d here in MAU reporting: today had spotting, patient denies wearing a pad reports "only when I pee" she has spotting. Patient also reports burning with urination, reports white creamy discharge for past 3 weeks with no odor per patient. Reports testing + for chlamydia and "wants to be treated for that before my induction".   Denies LOF and reports + FM     Onset of complaint: today Pain score: 0/10 There were no vitals filed for this visit.   BMW:[id number] straight back to room  Lab orders placed from triage:  n/a

## 2023-01-12 ENCOUNTER — Other Ambulatory Visit: Payer: Self-pay

## 2023-01-12 ENCOUNTER — Encounter (HOSPITAL_COMMUNITY): Payer: Self-pay | Admitting: Family Medicine

## 2023-01-12 ENCOUNTER — Encounter: Payer: Medicaid Other | Admitting: Family Medicine

## 2023-01-12 DIAGNOSIS — O99324 Drug use complicating childbirth: Secondary | ICD-10-CM | POA: Diagnosis not present

## 2023-01-12 DIAGNOSIS — O365930 Maternal care for other known or suspected poor fetal growth, third trimester, not applicable or unspecified: Secondary | ICD-10-CM | POA: Diagnosis not present

## 2023-01-12 DIAGNOSIS — O0933 Supervision of pregnancy with insufficient antenatal care, third trimester: Secondary | ICD-10-CM | POA: Diagnosis not present

## 2023-01-12 DIAGNOSIS — Z3A37 37 weeks gestation of pregnancy: Secondary | ICD-10-CM

## 2023-01-12 DIAGNOSIS — O99334 Smoking (tobacco) complicating childbirth: Secondary | ICD-10-CM | POA: Diagnosis not present

## 2023-01-12 LAB — GC/CHLAMYDIA PROBE AMP (~~LOC~~) NOT AT ARMC
Chlamydia: POSITIVE — AB
Comment: NEGATIVE
Comment: NORMAL
Neisseria Gonorrhea: NEGATIVE

## 2023-01-12 LAB — RPR: RPR Ser Ql: NONREACTIVE

## 2023-01-12 MED ORDER — ONDANSETRON HCL 4 MG/2ML IJ SOLN: 4.0000 mg | INTRAMUSCULAR | Status: DC | PRN

## 2023-01-12 MED ORDER — SODIUM CHLORIDE 0.9% FLUSH: 3.0000 mL | INTRAVENOUS | Status: DC | PRN

## 2023-01-12 MED ORDER — DIPHENHYDRAMINE HCL 25 MG PO CAPS: 25.0000 mg | ORAL_CAPSULE | Freq: Four times a day (QID) | ORAL | Status: DC | PRN

## 2023-01-12 MED ORDER — IBUPROFEN 800 MG PO TABS
800.0000 mg | ORAL_TABLET | Freq: Three times a day (TID) | ORAL | Status: DC
  Administered 2023-01-12 – 2023-01-14 (×8): 800 mg via ORAL
  Filled 2023-01-12 (×9): qty 1

## 2023-01-12 MED ORDER — WITCH HAZEL-GLYCERIN EX PADS: 1.0000 | MEDICATED_PAD | CUTANEOUS | Status: DC | PRN

## 2023-01-12 MED ORDER — COCONUT OIL OIL
1.0000 | TOPICAL_OIL | Status: DC | PRN
  Administered 2023-01-14: 1 via TOPICAL

## 2023-01-12 MED ORDER — ONDANSETRON HCL 4 MG PO TABS: 4.0000 mg | ORAL_TABLET | ORAL | Status: DC | PRN

## 2023-01-12 MED ORDER — SODIUM CHLORIDE 0.9% FLUSH: 3.0000 mL | Freq: Two times a day (BID) | INTRAVENOUS | Status: DC

## 2023-01-12 MED ORDER — DIBUCAINE (PERIANAL) 1 % EX OINT: 1.0000 | TOPICAL_OINTMENT | CUTANEOUS | Status: DC | PRN

## 2023-01-12 MED ORDER — DIPHENHYDRAMINE HCL 50 MG/ML IJ SOLN
25.0000 mg | Freq: Four times a day (QID) | INTRAMUSCULAR | Status: DC | PRN
Start: 2023-01-12 — End: 2023-01-12

## 2023-01-12 MED ORDER — BENZOCAINE-MENTHOL 20-0.5 % EX AERO
1.0000 | INHALATION_SPRAY | CUTANEOUS | Status: DC | PRN
  Administered 2023-01-13: 1 via TOPICAL
  Filled 2023-01-12: qty 56

## 2023-01-12 MED ORDER — ACETAMINOPHEN 325 MG PO TABS
650.0000 mg | ORAL_TABLET | ORAL | Status: DC | PRN
  Administered 2023-01-12 – 2023-01-13 (×2): 650 mg via ORAL
  Filled 2023-01-12 (×3): qty 2

## 2023-01-12 MED ORDER — SENNOSIDES-DOCUSATE SODIUM 8.6-50 MG PO TABS
2.0000 | ORAL_TABLET | ORAL | Status: DC
  Administered 2023-01-12 – 2023-01-14 (×3): 2 via ORAL
  Filled 2023-01-12 (×4): qty 2

## 2023-01-12 MED ORDER — OXYCODONE HCL 5 MG PO TABS
5.0000 mg | ORAL_TABLET | Freq: Four times a day (QID) | ORAL | Status: DC | PRN
  Administered 2023-01-12: 5 mg via ORAL
  Administered 2023-01-13: 10 mg via ORAL
  Filled 2023-01-12: qty 2
  Filled 2023-01-12: qty 1

## 2023-01-12 MED ORDER — SIMETHICONE 80 MG PO CHEW: 80.0000 mg | CHEWABLE_TABLET | ORAL | Status: DC | PRN

## 2023-01-12 MED ORDER — SODIUM CHLORIDE 0.9 % IV SOLN: 250.0000 mL | INTRAVENOUS | Status: DC | PRN

## 2023-01-12 MED ORDER — FAMOTIDINE 20 MG PO TABS
20.0000 mg | ORAL_TABLET | Freq: Two times a day (BID) | ORAL | Status: DC
  Administered 2023-01-12 – 2023-01-14 (×4): 20 mg via ORAL
  Filled 2023-01-12 (×5): qty 1

## 2023-01-12 MED ORDER — PRENATAL MULTIVITAMIN CH
1.0000 | ORAL_TABLET | Freq: Every day | ORAL | Status: DC
  Administered 2023-01-12 – 2023-01-14 (×3): 1 via ORAL
  Filled 2023-01-12 (×4): qty 1

## 2023-01-12 MED ORDER — DIPHENHYDRAMINE HCL 25 MG PO CAPS
25.0000 mg | ORAL_CAPSULE | Freq: Four times a day (QID) | ORAL | Status: DC | PRN
  Administered 2023-01-12: 25 mg via ORAL
  Filled 2023-01-12: qty 1

## 2023-01-12 MED ORDER — MEDROXYPROGESTERONE ACETATE 150 MG/ML IM SUSP: 150.0000 mg | INTRAMUSCULAR | Status: DC | PRN

## 2023-01-12 NOTE — Progress Notes (Signed)
Labor Progress Note Jamie [name] [name] is a 27 y.o. [phone] at [redacted]w[redacted]d presented for IOL 2/2 FGR.  S: Called to bedside to reassess pt -- pt reporting more pressure during ctx, but comfortable between ctx.  O:  BP 109/73   Pulse 60   Temp 97.9 F (36.6 C) (Oral)   Ht 5\' 9"  (1.753 m)   Wt 66.5 kg   LMP 04/24/2022   BMI 21.63 kg/m  EFM: 130/mod/+a/+variables Toco: q2-48min Dilation: 7 Effacement (%): 60 Station: -2 Presentation: Vertex Exam by:: Dr. Lucianne Muss   A&P: 27 y.o. [id number] [redacted]w[redacted]d here for IOL 2/2 FGR. #Labor: Progressing well. Anticipate SVD soon. #Pain: Requesting nitrous #FWB: Cat I #GBS negative  [name], MD 7:24 AM

## 2023-01-12 NOTE — Lactation Note (Signed)
This note was copied from a baby's chart. Lactation Consultation Note  Patient Name: Girl Alyannah Sanks HQION'G Date: 01/12/2023 Age:25 hours Reason for consult: Initial assessment;Early term 37-38.6wks;Exclusive pumping and bottle feeding  P4, Mother states she would like to formula feed and pump.  Set up DEBP with 21 mm flanges. During first initial pumping session, mother complained of discomfort and she did not like the feel of pumping. Turned suction to one drop. Mother states she will decide later if she wants to continue pumping.  Demonstrated how to use hand pump. Mother will call if help is needed.   Maternal Data Has patient been taught Hand Expression?: No Does the patient have breastfeeding experience prior to this delivery?: No  Feeding Mother's Current Feeding Choice: Breast Milk and Formula Nipple Type: Slow - flow Lactation Tools Discussed/Used Tools: Pump;Flanges Flange Size: 21 Breast pump type: Double-Electric Breast Pump;Manual Pump Education: Setup, frequency, and cleaning;Milk Storage Reason for Pumping: mother's choice Pumping frequency: q 3 hours Pumped volume:  (drops)  Interventions Interventions: Education;DEBP;Hand pump;LC Services brochure  Discharge Pump: Manual  Consult Status  PRN    [name] Boschen 01/12/2023, 2:17 PM

## 2023-01-12 NOTE — Telephone Encounter (Signed)
Per patient chart review. Patient was induced and had her baby. Maureen Ralphs RN on 01/12/23 at [phone]

## 2023-01-12 NOTE — Progress Notes (Signed)
Labor Progress Note Jamie [name] Jamie [name] is a 27 y.o. [phone] at [redacted]w[redacted]d presented for IOL 2/2 FGR.  S: Called to bedside to reassess pt -- pt more uncomfortable, requesting IV pain meds. She is quiet through ctx, rates them 6/10.   O:  BP 121/71   Pulse 85   Temp 97.9 F (36.6 C) (Oral)   Ht 5\' 9"  (1.753 m)   Wt 66.5 kg   LMP 04/24/2022   BMI 21.63 kg/m  EFM: 135/mod/+a/-d  Dilation: 5 Effacement (%): 50 Station: -2 Presentation: Vertex Exam by:: Dr. Lucianne Muss   A&P: 27 y.o. [id number] [redacted]w[redacted]d here for IOL 2/2 FGR. #Labor: Progressing well. Anticipate SVD soon. #Pain: IV Fentanyl given #FWB: Cat I #GBS negative  [name], MD 4:59 AM

## 2023-01-12 NOTE — Progress Notes (Signed)
Labor Progress Note Jamie [name] is a 27 y.o. [phone] at [redacted]w[redacted]d presented for IOL 2/2 FGR.  S: Pt comfortable at bedside, no concerns.  O:  BP 121/71   Pulse 85   Temp 97.9 F (36.6 C) (Oral)   Ht 5\' 9"  (1.753 m)   Wt 66.5 kg   LMP 04/24/2022   BMI 21.63 kg/m  EFM: 135/mod/+a/-d  CVE: Dilation: 3 Effacement (%): 50 Station: -2 Presentation: Vertex Exam by:: Dr. Lucianne Muss   A&P: 27 y.o. [id number] [redacted]w[redacted]d here for IOL 2/2 FGR. #Labor: Progressing well. Discussed AROM with patient, which she verbally consented to. AROM with clear fluid noted. Contracting regularly, if ctx space out, can start Pitocin.  #Pain: IV pain meds #FWB: Cat I #GBS negative  [name], MD 4:52 AM

## 2023-01-12 NOTE — Discharge Summary (Signed)
Postpartum Discharge Summary  Date of Service updated***     Patient Name: Jamie [name] DOB: [birth year] MRN: [id number]  Date of admission: 01/11/2023 Delivery date:01/12/2023 Delivering provider: [name] Date of discharge: 01/12/2023  Admitting diagnosis: Fetal growth restriction antepartum [O36.5990] Intrauterine pregnancy: [redacted]w[redacted]d     Secondary diagnosis:  Principal Problem:   SVD (spontaneous vaginal delivery) Active Problems:   Fetal growth restriction antepartum  Additional problems: ***    Discharge diagnosis: Term Pregnancy Delivered and fetal growth restriction                                               Post partum procedures:{Postpartum procedures:23558} Augmentation: AROM and Cytotec Complications: {OB Labor/Delivery Complications:20784}  Hospital course: Induction of Labor With Vaginal Delivery   27 y.o. yo [phone] at [redacted]w[redacted]d was admitted to the hospital 01/11/2023 for induction of labor.  Indication for induction:  fetal growth restriction .  Patient had an uncomplicated labor course Membrane Rupture Time/Date: 3:03 AM,01/12/2023  Delivery Method:Vaginal, Spontaneous Operative Delivery:N/A Episiotomy:   Lacerations:   None Details of delivery can be found in separate delivery note.  Patient had a postpartum course complicated by***. Patient is discharged home 01/12/23.  Newborn Data: Birth date:01/12/2023 Birth time:7:59 AM Gender:Female Living status:Living Apgars: ,  Weight:   Magnesium Sulfate received: No BMZ received: No Rhophylac:N/A MMR:N/A T-DaP:Given prenatally Flu: {XLK:44010} RSV Vaccine received: {RSV:31013} Transfusion:{Transfusion received:30440034}  Immunizations received: Immunization History  Administered Date(s) Administered   Tdap 01/31/2020, 11/13/2022    Physical exam  Vitals:   01/11/23 2110 01/11/23 2133 01/12/23 0013 01/12/23 0529  BP:  112/76 121/71 109/73  Pulse:  98 85 60  Temp:  97.9 F  (36.6 C)    TempSrc:  Oral    Weight: 66.5 kg     Height: 5\' 9"  (1.753 m)      General: {Exam; general:21111117} Lochia: {Desc; appropriate/inappropriate:30686::"appropriate"} Uterine Fundus: {Desc; firm/soft:30687} Incision: {Exam; incision:21111123} DVT Evaluation: {Exam; dvt:2111122} Labs: Lab Results  Component Value Date   WBC 8.3 01/11/2023   HGB 11.9 (L) 01/11/2023   HCT 35.9 (L) 01/11/2023   MCV 88.2 01/11/2023   PLT 264 01/11/2023      Latest Ref Rng & Units 08/04/2022   12:07 PM  CMP  Glucose 70 - 99 mg/dL 76   BUN 6 - 20 mg/dL 6   Creatinine 2.72 - 5.36 mg/dL 6.44   Sodium 034 - 742 mmol/L 134   Potassium 3.5 - 5.2 mmol/L 4.0   Chloride 96 - 106 mmol/L 102   CO2 20 - 29 mmol/L 18   Calcium 8.7 - 10.2 mg/dL 8.9   Total Protein 6.0 - 8.5 g/dL 5.8   Total Bilirubin 0.0 - 1.2 mg/dL 0.4   Alkaline Phos 44 - 121 IU/L 58   AST 0 - 40 IU/L 28   ALT 0 - 32 IU/L 20    Edinburgh Score:     No data to display         No data recorded  After visit meds:  Allergies as of 01/12/2023   No Known Allergies   Med Rec must be completed prior to using this [facility]***        Discharge home in stable condition Infant Feeding: {Baby feeding:23562} Infant Disposition:{CHL IP OB HOME WITH VZDGLO:75643} Discharge instruction: per After Visit Summary and Postpartum  booklet. Activity: Advance as tolerated. Pelvic rest for 6 weeks.  Diet: {OB WUJW:11914782} Future Appointments:No future appointments. Follow up Visit: Message sent to [facility] 10/7  Please schedule this patient for a In person postpartum visit in 4 weeks with the following provider: Any provider. Additional Postpartum F/U: n/a   Low risk pregnancy complicated by:  FGR Delivery mode:  Vaginal, Spontaneous Anticipated Birth Control:  Depo   01/12/2023 [name], MD

## 2023-01-13 MED ORDER — OXYCODONE HCL 5 MG PO TABS: 5.0000 mg | ORAL_TABLET | ORAL | Status: DC | PRN

## 2023-01-13 NOTE — Lactation Note (Addendum)
This note was copied from a baby's chart. Lactation Consultation Note  Patient Name: Jamie [name] [id number] Date: 01/13/2023 Age:25 hours Reason for consult: Follow-up assessment  P4, Mother states it leaks from the bottom of her flanges.  Discussed sitting upright and offered to check fit.  Mother requested LC come back in one hour.  Discussed nipple flow and provided a Nfant standard white nipple and offered to assist with feeding.  Mother states baby had recently fed and suggest LC come back later.   Returned to room to assist mother with pumping.  Mother states she would like to wait until after she has showered.  Suggest calling or LC will check tonight.  Mother states baby did well using Nfant standard white nipple/ Consumed 30 ml.     Maternal Data Does the patient have breastfeeding experience prior to this delivery?: No  Feeding Mother's Current Feeding Choice: Breast Milk and Formula Nipple Type: Slow - flow  Interventions Interventions: Education Consult Status Consult Status: Follow-up Date: 01/13/23 Follow-up type: In-patient    [name] [facility] 01/13/2023, 11:04 AM

## 2023-01-13 NOTE — Clinical Social Work Maternal (Signed)
CLINICAL SOCIAL WORK MATERNAL/CHILD NOTE  Patient Details  Name: Jamie [name] MRN: [id number] Date of Birth: Dec 24, 1995  Date:  01/13/2023  Clinical Social Worker Initiating Note:  Willaim Rayas Jarrad Mclees Date/Time: Initiated:  01/13/23/1157     Child's Name:  Jamie [name] 01/12/2023   Biological Parents:  Mother, Father Jamie [name] 13[birth year]1997, Jamie [name] 03/06/2000)   Need for Interpreter:  None   Reason for Referral:  Current Substance Use/Substance Use During Pregnancy     Address:  [address] [place] [address]    Phone number:  [phone] (home)     Additional phone number:   Household Members/Support Persons (HM/SP):   Household Member/Support Person 1, Household Member/Support Person 2, Household Member/Support Person 3   HM/SP Name Relationship DOB or Age  HM/SP -1 Jamie [name] daughter 11/04/2016  HM/SP -2 Jamie [name] daughter 12/07/2017  HM/SP -3 Jamie [name] son 04/06/2020  HM/SP -4        HM/SP -5        HM/SP -6        HM/SP -7        HM/SP -8          Natural Supports (not living in the home):      Professional Supports: None   Employment: Unemployed   Type of Work:     Education:  9 to 11 years   Homebound arranged: No  Financial Resources:  [employer]   Other Resources:  Sales executive  , [employer]   Cultural/Religious Considerations Which May Impact Care:    Strengths:  Ability to meet basic needs  , Merchandiser, retail, 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 Csw met with MOB to complete assessment and offer support. CSW entered the room introduced self, CSW role and reason for visit, MOB was agreeable to visit and allowed FOB to remain in the room. CSW inquired about how MOB was feeling, MOB reported feeling tired. CSW confirmed MOB demographic information, MOB verified the information on file was correct.   CSW inquired about MOB MH hx, MOB reported she has experienced depression in the past but reported she feels fine now and had a stable mood throughout pregnancy. CSW provided education regarding the baby blues period vs. perinatal mood disorders, discussed treatment and gave resources for mental health follow up if concerns arise.  CSW recommends self-evaluation during the postpartum time period using the New Mom Checklist from Postpartum Progress and encouraged MOB to contact a medical professional if symptoms are noted at any time. MOB identified FOB as her support. CSW inquired about noted THC use, MOB reported her last use was in August. CSW explained the hospital drug screen policy, MOB verbalized understanding. CSW informed MOB infants UDS was negative and the CDS was pending MOB verbalized understanding. CSW notified MOB a CPS report would be made if warranted. CSW inquired abut noted lack of  transportation, MOB reported she now utilizes Medicaid non emergent transport.  CSW provided review of Sudden Infant Death Syndrome (SIDS) precautions.  MOB reported she has all necessary items for the infant including a pack n play and car seat. CSW identifies no further need for intervention and no barriers to discharge at this time.  CSW Plan/Description:  No Further Intervention Required/No Barriers to Discharge, Sudden Infant Death Syndrome (SIDS) Education, Perinatal Mood and Anxiety Disorder (PMADs) Education, Hospital Drug Screen Policy Information, CSW Will Continue to Monitor Umbilical Cord Tissue Drug Screen  Results and Make Report if Sandy Salaam, LCSW 01/13/2023, 12:14 PM

## 2023-01-13 NOTE — Lactation Note (Signed)
This note was copied from a baby's chart. Lactation Consultation Note  Patient Name: Jamie [name] [id number] Date: 01/13/2023 Age:25 hours Reason for consult: Follow-up assessment  P4, Returned to room and assisted mother with pumping using 21 mm flanges.     Feeding Mother's Current Feeding Choice: Breast Milk and Formula  Lactation Tools Discussed/Used Tools: Pump;Flanges Flange Size: 21 Breast pump type: Double-Electric Breast Pump;Manual Pump Education: Milk Storage;Setup, frequency, and cleaning Reason for Pumping: mother's choice Pumping frequency: q 3 hours  Interventions Interventions: DEBP;Education  Discharge Pump: Manual  Consult Status Consult Status: Follow-up Date: 01/14/23 Follow-up type: In-patient    [name] [facility] 01/13/2023, 3:31 PM

## 2023-01-13 NOTE — Progress Notes (Signed)
Post Partum Day 1 Subjective: no complaints, up ad lib, voiding, and tolerating PO  Objective: Blood pressure 105/75, pulse 99, temperature 97.6 F (36.4 C), resp. rate 16, height 5\' 9"  (1.753 m), weight 66.5 kg, last menstrual period 04/24/2022, SpO2 100%, unknown if currently breastfeeding.  Physical Exam:  General: alert, cooperative, and no distress Lochia: appropriate Uterine Fundus: firm Incision: n/a DVT Evaluation: No evidence of DVT seen on physical exam. Negative Homan's sign.  Recent Labs    01/11/23 2155  HGB 11.9*  HCT 35.9*    Assessment/Plan: Plan for discharge tomorrow and Breastfeeding   LOS: 2 days   Levie Heritage, DO 01/13/2023, 9:19 AM

## 2023-01-14 ENCOUNTER — Other Ambulatory Visit (HOSPITAL_COMMUNITY): Payer: Self-pay

## 2023-01-14 LAB — SURGICAL PATHOLOGY

## 2023-01-14 MED ORDER — ACETAMINOPHEN 325 MG PO TABS
650.0000 mg | ORAL_TABLET | ORAL | 0 refills | Status: AC | PRN
  Filled 2023-01-14: qty 30, 3d supply, fill #0

## 2023-01-14 MED ORDER — IBUPROFEN 800 MG PO TABS
800.0000 mg | ORAL_TABLET | Freq: Three times a day (TID) | ORAL | 0 refills | Status: DC
  Filled 2023-01-14 (×2): qty 30, 10d supply, fill #0

## 2023-01-14 MED ORDER — SENNOSIDES-DOCUSATE SODIUM 8.6-50 MG PO TABS: 2.0000 | ORAL_TABLET | ORAL | 0 refills | Status: DC

## 2023-01-14 MED ORDER — FAMOTIDINE 20 MG PO TABS: 20.0000 mg | ORAL_TABLET | Freq: Two times a day (BID) | ORAL | 0 refills | Status: DC

## 2023-01-14 MED ORDER — POLYETHYLENE GLYCOL 3350 17 GM/SCOOP PO POWD
17.0000 g | Freq: Every day | ORAL | 2 refills | Status: AC
Start: 2023-01-14 — End: ?
  Filled 2023-01-14: qty 500, 29d supply, fill #0
  Filled 2023-01-14: qty 476, 28d supply, fill #0

## 2023-01-14 MED ORDER — FAMOTIDINE 20 MG PO TABS
20.0000 mg | ORAL_TABLET | Freq: Two times a day (BID) | ORAL | 0 refills | Status: AC
  Filled 2023-01-14 (×2): qty 60, 30d supply, fill #0

## 2023-01-14 MED ORDER — PRENATAL 27-0.8 MG PO TABS: 1.0000 | ORAL_TABLET | Freq: Every day | ORAL | 3 refills | Status: AC

## 2023-01-14 MED ORDER — IBUPROFEN 800 MG PO TABS: 800.0000 mg | ORAL_TABLET | Freq: Three times a day (TID) | ORAL | 0 refills | Status: DC

## 2023-01-14 NOTE — Progress Notes (Signed)
Patient given discharge instructions and denies any questions or concerns.

## 2023-01-14 NOTE — Lactation Note (Addendum)
This note was copied from a baby's chart. Lactation Consultation Note  Patient Name: Jamie [name] [id number] Date: 01/14/2023 Age:25 hours Reason for consult: Follow-up assessment;1st time breastfeeding;Early term 37-38.6wks;Infant < 6lbs  P4, Baby [redacted]w[redacted]d. Baby sleeping beside mother.  Offered to put infant in crib and mother declined.  Mother is concerned because her breasts are filling.  Reviewed filling versus engorgement and encouraged pumping regularly/hand express and ice.    Weight stabilizing at 5.4%. 5 voids and 5 stools in the last 24 hours.  Mother states she pumped 35 ml with her last pumping session and baby received 31 ml.  Mother has manual pump.  [facility] referral sent for DEBP.     Encouraged pumping with DEBP q 3 hours.  Mother will give breastmilk and alternate with 22kcal Formula.  Wake baby for feedings if needed.   Reviewed engorgement care and monitoring voids/stools.   Maternal Data Has patient been taught Hand Expression?: Yes Does the patient have breastfeeding experience prior to this delivery?: No  Feeding Mother's Current Feeding Choice: Breast Milk and Formula  Lactation Tools Discussed/Used Tools: Pump;Flanges Flange Size: 21 Pumping frequency: q 3 hours  Interventions Interventions: Education  Discharge Discharge Education: Engorgement and breast care;Warning signs for feeding baby Pump:  [facility] referral sent)  Consult Status Consult Status: Complete Date: 01/14/23    [name] [facility] 01/14/2023, 8:50 AM

## 2023-01-15 ENCOUNTER — Ambulatory Visit (HOSPITAL_COMMUNITY): Payer: Self-pay

## 2023-01-15 NOTE — Lactation Note (Signed)
This note was copied from a baby's chart. Lactation Consultation Note  Patient Name: Jamie [name] [id number] Date: 01/15/2023 Age:25 hours Reason for consult: Follow-up assessment;Early term 34-38.6wks  Spoke with Jamie [name] inquiring about WIC.  WIC has contacted her for a loaner but is requesting additional information before giving a pump.  Offered [facility] loaner pump and suggest family call LC for deposit.  Maternal Data Has patient been taught Hand Expression?: Yes Does the patient have breastfeeding experience prior to this delivery?: No  Feeding Jamie [name]'s Current Feeding Choice: Breast Milk and Formula Interventions Interventions: DEBP;Education  Discharge Pump:  (Advised to contact [facility])  Consult Status Consult Status: Complete    Hardie Pulley  RN IBCLC 01/15/2023, 2:23 PM

## 2023-01-19 ENCOUNTER — Telehealth: Payer: Self-pay

## 2023-01-19 NOTE — Telephone Encounter (Signed)
Pt sent MyChart message stating she will need sooner PP visit:   I'm going to need something sooner my breast are killing me   Called pt in response to message, VM left stating I am calling in response to message. Callback number given.

## 2023-01-21 ENCOUNTER — Encounter: Payer: Self-pay | Admitting: Advanced Practice Midwife

## 2023-02-07 ENCOUNTER — Telehealth (HOSPITAL_COMMUNITY): Payer: Self-pay | Admitting: *Deleted

## 2023-02-07 NOTE — Telephone Encounter (Signed)
Attempted hospital discharge follow-up call. Left message for patient to return RN call with any questions or concerns. Deforest Hoyles, RN, 02/07/23, [phone]

## 2023-02-10 ENCOUNTER — Ambulatory Visit: Payer: Medicaid Other | Admitting: Family Medicine

## 2023-03-12 ENCOUNTER — Encounter: Payer: Self-pay | Admitting: *Deleted

## 2023-03-29 ENCOUNTER — Encounter: Payer: Self-pay | Admitting: Obstetrics and Gynecology

## 2023-04-14 ENCOUNTER — Encounter (HOSPITAL_COMMUNITY): Payer: Self-pay

## 2023-04-14 ENCOUNTER — Ambulatory Visit (HOSPITAL_COMMUNITY)
Admission: EM | Admit: 2023-04-14 | Discharge: 2023-04-14 | Disposition: A | Discharge status: Home / Self Care | Payer: Medicaid Other | Attending: Family Medicine | Admitting: Family Medicine

## 2023-04-14 DIAGNOSIS — R300 Dysuria: Secondary | ICD-10-CM | POA: Insufficient documentation

## 2023-04-14 DIAGNOSIS — N760 Acute vaginitis: Secondary | ICD-10-CM | POA: Insufficient documentation

## 2023-04-14 LAB — POCT URINALYSIS DIP (MANUAL ENTRY)
Blood, UA: NEGATIVE
Glucose, UA: NEGATIVE mg/dL
Leukocytes, UA: NEGATIVE
Nitrite, UA: NEGATIVE
Protein Ur, POC: 30 mg/dL — AB
Spec Grav, UA: 1.025 (ref 1.010–1.025)
Urobilinogen, UA: 0.2 U/dL
pH, UA: 6 (ref 5.0–8.0)

## 2023-04-14 LAB — POCT URINE PREGNANCY: Preg Test, Ur: NEGATIVE

## 2023-04-14 NOTE — ED Provider Notes (Signed)
 [facility]    CSN: [id number] Arrival date & time: 04/14/23  1421      History   Chief Complaint Chief Complaint  Patient presents with   Vaginal Discharge    HPI Jamie [name] is a 28 y.o. female.    Vaginal Discharge Here for vaginal discharge is been going on since October, right after her baby was born.  At that time she was treated for chlamydia.  She was compliant with the medication and her partner did take doxycycline  also.  She is now having a little bit of slight burning in her lower abdomen when she urinates and maybe some dysuria 2.  No urinary frequency.  Last menstrual cycle was December 18 and that was 4 days long and this is shorter than usual.  She has had decreased appetite in the last month or 2, but she does not really describe any nausea and no vomiting.  She does admit to some downturn in mood about every other day.  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   Hx of preeclampsia, prior pregnancy, currently pregnant 08/04/2022   Supervision of other normal pregnancy, antepartum 07/29/2022   Trichomonal vaginitis in pregnancy in first trimester 06/23/2022   Atypical squamous cells of undetermined significance (ASCUS) on Papanicolaou smear of cervix 03/19/2020   Tobacco abuse 03/19/2020    Past Surgical History:  Procedure Laterality Date   NO PAST SURGERIES       OB History     Gravida  7   Para  4   Term  4   Preterm  0   AB  2   Living  4      SAB  2   IAB  0   Ectopic  0   Multiple  0   Live Births  4            Home Medications    Prior to Admission medications   Medication Sig Start Date End Date Taking? Authorizing Provider  acetaminophen  (TYLENOL ) 325 MG tablet Take 2 tablets (650 mg total) by mouth every 4 (four) hours as needed (for pain scale < 4). 01/14/23   Lola Donnice HERO, MD  Blood Pressure Monitoring DEVI 1 each by Does not apply route once a week. Patient not taking: Reported on 01/01/2023 07/29/22   Zina Jerilynn LABOR, MD  famotidine  (PEPCID ) 20 MG tablet Take 1 tablet (20 mg total) by mouth 2 (two) times daily. 01/14/23   Lola Donnice HERO, MD  polyethylene glycol powder (GLYCOLAX /MIRALAX ) 17 GM/SCOOP powder Dissolve 1 capful (17 g) in water  and take by mouth daily. 01/14/23   Lola Donnice HERO, MD  Prenatal Vit-Fe Fumarate-FA (MULTIVITAMIN-PRENATAL) 27-0.8 MG TABS tablet Take 1 tablet by mouth daily at 12 noon. 01/14/23   Kumar, Agnijita, MD    Family History Family History  Problem Relation Age of Onset  Hypertension Mother    Healthy Father    Hypertension Maternal Grandmother     Social History Social History   Tobacco Use   Smoking status: Every Day    Types: Cigarettes   Smokeless tobacco: Never  Vaping Use   Vaping status: Every Day   Substances: Nicotine, THC, Flavoring  Substance Use Topics   Alcohol use: Not Currently    Comment: once a month   Drug use: Yes    Frequency: 7.0 times per week    Types: Marijuana     Allergies   Patient has no known allergies.   Review of Systems Review of Systems  Genitourinary:  Positive for vaginal discharge.     Physical Exam Triage Vital Signs ED Triage Vitals  Encounter Vitals Group     BP 04/14/23 1445 119/80     Systolic BP Percentile --      Diastolic BP Percentile --      Pulse Rate 04/14/23 1445 94     Resp  04/14/23 1445 14     Temp 04/14/23 1445 98.3 F (36.8 C)     Temp Source 04/14/23 1445 Oral     SpO2 04/14/23 1445 99 %     Weight --      Height --      Head Circumference --      Peak Flow --      Pain Score 04/14/23 1444 0     Pain Loc --      Pain Education --      Exclude from Growth Chart --    No data found.  Updated Vital Signs BP 119/80 (BP Location: Left Arm)   Pulse 94   Temp 98.3 F (36.8 C) (Oral)   Resp 14   LMP 03/25/2023 (Approximate)   SpO2 99%   Visual Acuity Right Eye Distance:   Left Eye Distance:   Bilateral Distance:    Right Eye Near:   Left Eye Near:    Bilateral Near:     Physical Exam Vitals reviewed.  Constitutional:      General: She is not in acute distress.    Appearance: She is not ill-appearing, toxic-appearing or diaphoretic.  HENT:     Mouth/Throat:     Mouth: Mucous membranes are moist.  Eyes:     Extraocular Movements: Extraocular movements intact.     Pupils: Pupils are equal, round, and reactive to light.  Cardiovascular:     Rate and Rhythm: Normal rate and regular rhythm.     Heart sounds: No murmur heard. Pulmonary:     Effort: Pulmonary effort is normal.     Breath sounds: Normal breath sounds.  Abdominal:     Palpations: Abdomen is soft.     Comments: There is some very mild tenderness of the suprapubic area  Skin:    Capillary Refill: Capillary refill takes less than 2 seconds.     Coloration: Skin is not jaundiced or pale.  Neurological:     General: No focal deficit present.     Mental Status: She is alert and oriented to person, place, and time.  Psychiatric:        Behavior: Behavior normal.      UC Treatments / Results  Labs (all labs ordered are listed, but only abnormal results are displayed) Labs Reviewed  POCT URINALYSIS DIP (MANUAL ENTRY) - Abnormal; Notable for the following components:      Result Value   Clarity, UA cloudy (*)  Bilirubin, UA small (*)    Ketones, POC UA trace (5) (*)     Protein Ur, POC =30 (*)    All other components within normal limits  URINE CULTURE  POCT URINE PREGNANCY  CERVICOVAGINAL ANCILLARY ONLY    EKG   Radiology No results found.  Procedures Procedures (including critical care time)  Medications Ordered in UC Medications - No data to display  Initial Impression / Assessment and Plan / UC Course  I have reviewed the triage vital signs and the nursing notes.  Pertinent labs & imaging results that were available during my care of the patient were reviewed by me and considered in my medical decision making (see chart for details).     UPT is negative  Urinalysis is cloudy with some bilirubin and a trace of ketones and low protein.  Urine culture is sent.  Vaginal self swab is done, and we will notify of any positives on that and treat per protocol. We decided against empiric treatment for any possible UTI or STI. Final Clinical Impressions(s) / UC Diagnoses   Final diagnoses:  Acute vaginitis  Dysuria     Discharge Instructions      The pregnancy test was negative  The urinalysis did not show any definite signs of urinary infection.  Urine culture is still sent since the urine was cloudy.  Staff will notify you if there is anything positive on the swab or on the urine culture..      ED Prescriptions   None    PDMP not reviewed this encounter.   Vonna Sharlet POUR, MD 04/14/23 [phone]

## 2023-04-14 NOTE — Discharge Instructions (Signed)
 The pregnancy test was negative  The urinalysis did not show any definite signs of urinary infection.  Urine culture is still sent since the urine was cloudy.  Staff will notify you if there is anything positive on the swab or on the urine culture.Marland Kitchen

## 2023-04-14 NOTE — ED Triage Notes (Signed)
 Patient reports that she has had a clear  foul smelling vaginal discharge x 2 months. Patient states slight lower abdominal pain as well.

## 2023-04-15 LAB — CERVICOVAGINAL ANCILLARY ONLY
Bacterial Vaginitis (gardnerella): POSITIVE — AB
Candida Glabrata: NEGATIVE
Candida Vaginitis: POSITIVE — AB
Chlamydia: NEGATIVE
Comment: NEGATIVE
Comment: NEGATIVE
Comment: NEGATIVE
Comment: NEGATIVE
Comment: NEGATIVE
Comment: NORMAL
Neisseria Gonorrhea: NEGATIVE
Trichomonas: NEGATIVE

## 2023-04-16 ENCOUNTER — Telehealth (HOSPITAL_COMMUNITY): Payer: Self-pay | Admitting: Emergency Medicine

## 2023-04-16 LAB — URINE CULTURE

## 2023-04-16 MED ORDER — FLUCONAZOLE 150 MG PO TABS: 150.0000 mg | ORAL_TABLET | Freq: Once | ORAL | 0 refills | Status: AC

## 2023-04-16 MED ORDER — METRONIDAZOLE 500 MG PO TABS: 500.0000 mg | ORAL_TABLET | Freq: Two times a day (BID) | ORAL | 0 refills | Status: AC

## 2023-04-16 NOTE — Telephone Encounter (Signed)
 Metronidazole for positive BV, diflucan for positive yeast, per protocol LMP 12/18

## 2023-05-03 ENCOUNTER — Emergency Department (HOSPITAL_COMMUNITY)
Admission: EM | Admit: 2023-05-03 | Discharge: 2023-05-03 | Discharge status: Against Medical Advice | Payer: Medicaid Other | Attending: Emergency Medicine | Admitting: Emergency Medicine

## 2023-05-03 ENCOUNTER — Ambulatory Visit: Payer: Medicaid Other

## 2023-05-03 ENCOUNTER — Other Ambulatory Visit: Payer: Self-pay

## 2023-05-03 ENCOUNTER — Encounter (HOSPITAL_COMMUNITY): Payer: Self-pay | Admitting: *Deleted

## 2023-05-03 DIAGNOSIS — K625 Hemorrhage of anus and rectum: Secondary | ICD-10-CM | POA: Diagnosis present

## 2023-05-03 DIAGNOSIS — Z5321 Procedure and treatment not carried out due to patient leaving prior to being seen by health care provider: Secondary | ICD-10-CM | POA: Insufficient documentation

## 2023-05-03 LAB — COMPREHENSIVE METABOLIC PANEL
ALT: 13 U/L (ref 0–44)
AST: 21 U/L (ref 15–41)
Albumin: 3.6 g/dL (ref 3.5–5.0)
Alkaline Phosphatase: 43 U/L (ref 38–126)
Anion gap: 9 (ref 5–15)
BUN: 10 mg/dL (ref 6–20)
CO2: 25 mmol/L (ref 22–32)
Calcium: 9.3 mg/dL (ref 8.9–10.3)
Chloride: 106 mmol/L (ref 98–111)
Creatinine, Ser: 0.81 mg/dL (ref 0.44–1.00)
GFR, Estimated: >60 mL/min (ref >60)
Glucose, Bld: 94 mg/dL (ref 70–99)
Potassium: 4.2 mmol/L (ref 3.5–5.1)
Sodium: 140 mmol/L (ref 135–145)
Total Bilirubin: 1 mg/dL (ref 0.0–1.2)
Total Protein: 6.5 g/dL (ref 6.5–8.1)

## 2023-05-03 LAB — CBC WITH DIFFERENTIAL/PLATELET
Abs Immature Granulocytes: 0 10*3/uL (ref 0.00–0.07)
Basophils Absolute: 0 10*3/uL (ref 0.0–0.1)
Basophils Relative: 1 %
Eosinophils Absolute: 0.1 10*3/uL (ref 0.0–0.5)
Eosinophils Relative: 3 %
HCT: 41.1 % (ref 36.0–46.0)
Hemoglobin: 13.6 g/dL (ref 12.0–15.0)
Immature Granulocytes: 0 %
Lymphocytes Relative: 63 %
Lymphs Abs: 2.5 10*3/uL (ref 0.7–4.0)
MCH: 30.5 pg (ref 26.0–34.0)
MCHC: 33.1 g/dL (ref 30.0–36.0)
MCV: 92.2 fL (ref 80.0–100.0)
Monocytes Absolute: 0.4 10*3/uL (ref 0.1–1.0)
Monocytes Relative: 10 %
Neutro Abs: 0.9 10*3/uL — ABNORMAL LOW (ref 1.7–7.7)
Neutrophils Relative %: 23 %
Platelets: 254 10*3/uL (ref 150–400)
RBC: 4.46 MIL/uL (ref 3.87–5.11)
RDW: 13 % (ref 11.5–15.5)
Smear Review: NORMAL
WBC: 3.9 10*3/uL — ABNORMAL LOW (ref 4.0–10.5)
nRBC: 0 % (ref 0.0–0.2)

## 2023-05-03 LAB — HCG, SERUM, QUALITATIVE: Preg, Serum: NEGATIVE

## 2023-05-03 NOTE — ED Triage Notes (Signed)
The pt has abd pain and she has had rectal bleeding  for 2 days  she also has hemorrhoids  [email or web address]

## 2023-05-03 NOTE — ED Provider Triage Note (Signed)
Emergency Medicine Provider Triage Evaluation Note  Jamie [name] , a 28 y.o. female  was evaluated in triage.  Pt complains of 3 days of rectal bleeding during bowel movements. Says she feels a lump near anus. Reports painful bowel movements. Hx of hemorrhoids. States that bleeding has been worse than normal, described as "like on a period." Only bleeding when straining for bowel movement. LMP 04/21/2023. Currently taking metronidazole for UTI started 2 days ago.   Endorses fatigue, nausea, lower abdominal pain, 1 episode diarrhea yesterday.  Denies Fevers, chest pain, shortness of breath, vomiting, dysuria, LE swelling/pain.   Review of Systems  Positive: See above Negative: See above  Physical Exam  BP 117/76 (BP Location: Right Arm)   Pulse 75   Temp 98 F (36.7 C)   Resp 16   Ht 5\' 9"  (1.753 m)   Wt 66.5 kg   LMP 04/13/2023 (Approximate)   SpO2 100%   BMI 21.65 kg/m  Gen:   Awake, no distress   Resp:  Normal effort  MSK:   Moves extremities without difficulty  Other:    Medical Decision Making  Medically screening exam initiated at 4:26 PM.  Appropriate orders placed.  Jamie [name] was informed that the remainder of the evaluation will be completed by another provider, this initial triage assessment does not replace that evaluation, and the importance of remaining in the ED until their evaluation is complete.     Lunette Stands, [place] 05/03/23 [phone]

## 2023-05-03 NOTE — ED Notes (Signed)
Pt came to this NT and stated they were leaving. Pt seen leaving ED.

## 2023-05-04 ENCOUNTER — Emergency Department (HOSPITAL_COMMUNITY)
Admission: EM | Admit: 2023-05-04 | Discharge: 2023-05-04 | Disposition: A | Discharge status: Home / Self Care | Payer: Medicaid Other | Attending: Emergency Medicine | Admitting: Emergency Medicine

## 2023-05-04 ENCOUNTER — Encounter (HOSPITAL_COMMUNITY): Payer: Self-pay | Admitting: Emergency Medicine

## 2023-05-04 ENCOUNTER — Emergency Department (HOSPITAL_COMMUNITY): Payer: Medicaid Other

## 2023-05-04 ENCOUNTER — Other Ambulatory Visit: Payer: Self-pay

## 2023-05-04 DIAGNOSIS — R110 Nausea: Secondary | ICD-10-CM | POA: Diagnosis not present

## 2023-05-04 DIAGNOSIS — M549 Dorsalgia, unspecified: Secondary | ICD-10-CM | POA: Insufficient documentation

## 2023-05-04 DIAGNOSIS — K625 Hemorrhage of anus and rectum: Secondary | ICD-10-CM | POA: Insufficient documentation

## 2023-05-04 DIAGNOSIS — D696 Thrombocytopenia, unspecified: Secondary | ICD-10-CM | POA: Diagnosis not present

## 2023-05-04 LAB — URINALYSIS, ROUTINE W REFLEX MICROSCOPIC
Bilirubin Urine: NEGATIVE
Glucose, UA: NEGATIVE mg/dL
Hgb urine dipstick: NEGATIVE
Ketones, ur: NEGATIVE mg/dL
Leukocytes,Ua: NEGATIVE
Nitrite: NEGATIVE
Protein, ur: NEGATIVE mg/dL
Specific Gravity, Urine: 1.021 (ref 1.005–1.030)
pH: 7 (ref 5.0–8.0)

## 2023-05-04 LAB — BASIC METABOLIC PANEL
Anion gap: 7 (ref 5–15)
BUN: 10 mg/dL (ref 6–20)
CO2: 25 mmol/L (ref 22–32)
Calcium: 9.1 mg/dL (ref 8.9–10.3)
Chloride: 106 mmol/L (ref 98–111)
Creatinine, Ser: 0.82 mg/dL (ref 0.44–1.00)
GFR, Estimated: >60 mL/min (ref >60)
Glucose, Bld: 80 mg/dL (ref 70–99)
Potassium: 4.1 mmol/L (ref 3.5–5.1)
Sodium: 138 mmol/L (ref 135–145)

## 2023-05-04 LAB — CBC WITH DIFFERENTIAL/PLATELET
Abs Immature Granulocytes: 0 10*3/uL (ref 0.00–0.07)
Basophils Absolute: 0 10*3/uL (ref 0.0–0.1)
Basophils Relative: 1 %
Eosinophils Absolute: 0.1 10*3/uL (ref 0.0–0.5)
Eosinophils Relative: 2 %
HCT: 39.5 % (ref 36.0–46.0)
Hemoglobin: 13.2 g/dL (ref 12.0–15.0)
Immature Granulocytes: 0 %
Lymphocytes Relative: 55 %
Lymphs Abs: 2.1 10*3/uL (ref 0.7–4.0)
MCH: 30.6 pg (ref 26.0–34.0)
MCHC: 33.4 g/dL (ref 30.0–36.0)
MCV: 91.6 fL (ref 80.0–100.0)
Monocytes Absolute: 0.5 10*3/uL (ref 0.1–1.0)
Monocytes Relative: 12 %
Neutro Abs: 1.1 10*3/uL — ABNORMAL LOW (ref 1.7–7.7)
Neutrophils Relative %: 30 %
Platelets: 251 10*3/uL (ref 150–400)
RBC: 4.31 MIL/uL (ref 3.87–5.11)
RDW: 13 % (ref 11.5–15.5)
WBC: 3.7 10*3/uL — ABNORMAL LOW (ref 4.0–10.5)
nRBC: 0 % (ref 0.0–0.2)

## 2023-05-04 LAB — POC OCCULT BLOOD, ED: Fecal Occult Bld: NEGATIVE

## 2023-05-04 LAB — PATHOLOGIST SMEAR REVIEW

## 2023-05-04 LAB — PREGNANCY, URINE: Preg Test, Ur: NEGATIVE

## 2023-05-04 MED ORDER — IOHEXOL 350 MG/ML SOLN
75.0000 mL | Freq: Once | INTRAVENOUS | Status: AC | PRN
  Administered 2023-05-04: 75 mL via INTRAVENOUS

## 2023-05-04 MED ORDER — DOCUSATE SODIUM 100 MG PO CAPS: 100.0000 mg | ORAL_CAPSULE | Freq: Two times a day (BID) | ORAL | 1 refills | Status: AC

## 2023-05-04 NOTE — ED Provider Notes (Signed)
[place] [facility] AT [facility] Provider Note   CSN: [id number] Arrival date & time: 05/04/23  1318     History  Chief Complaint  Patient presents with   Rectal Bleeding    Jamie [name] is a 28 y.o. female with noncontributory past medical history presents concern for rectal bleeding for 3 days with abdominal pain.  Patient Dors is a history of hemorrhoids in the past but reports that has not been this heavy.  She endorses some back pain, nausea.  She denies any blood thinner usage.  She denies any vomiting, fever.  Reports 3 months postpartum.  Received labs yesterday for same but left due to wait time.  No previous history of GI bleed.  No previous history of diverticulosis or diverticulitis.  Does report some pain at time my evaluation.   Rectal Bleeding      Home Medications Prior to Admission medications   Medication Sig Start Date End Date Taking? Authorizing Provider  docusate sodium (COLACE) 100 MG capsule Take 1 capsule (100 mg total) by mouth every 12 (twelve) hours. 05/04/23  Yes Khamila Bassinger H, PA-C  acetaminophen (TYLENOL) 325 MG tablet Take 2 tablets (650 mg total) by mouth every 4 (four) hours as needed (for pain scale < 4). 01/14/23   Venora Maples, MD  Blood Pressure Monitoring DEVI 1 each by Does not apply route once a week. Patient not taking: Reported on 01/01/2023 07/29/22   Warden Fillers, MD  famotidine (PEPCID) 20 MG tablet Take 1 tablet (20 mg total) by mouth 2 (two) times daily. 01/14/23   Venora Maples, MD  metroNIDAZOLE (FLAGYL) 500 MG tablet Take 1 tablet (500 mg total) by mouth 2 (two) times daily. 04/16/23   Lamptey, Britta Mccreedy, MD  polyethylene glycol powder (GLYCOLAX/MIRALAX) 17 GM/SCOOP powder Dissolve 1 capful (17 g) in water and take by mouth daily. 01/14/23   Venora Maples, MD  Prenatal Vit-Fe Fumarate-FA (MULTIVITAMIN-PRENATAL) 27-0.8 MG TABS tablet Take 1 tablet by mouth daily at 12  noon. 01/14/23   [name], MD      Allergies    Patient has no known allergies.    Review of Systems   Review of Systems  Gastrointestinal:  Positive for hematochezia.  All other systems reviewed and are negative.   Physical Exam Updated Vital Signs BP 111/72 (BP Location: Left Arm)   Pulse 73   Temp 97.9 F (36.6 C) (Oral)   Resp 16   Wt 66 kg   LMP 04/13/2023 (Approximate)   SpO2 100%   BMI 21.49 kg/m  Physical Exam Vitals and nursing note reviewed.  Constitutional:      General: She is not in acute distress.    Appearance: Normal appearance.  HENT:     Head: Normocephalic and atraumatic.  Eyes:     General:        Right eye: No discharge.        Left eye: No discharge.  Cardiovascular:     Rate and Rhythm: Normal rate and regular rhythm.     Heart sounds: No murmur heard.    No friction rub. No gallop.  Pulmonary:     Effort: Pulmonary effort is normal.     Breath sounds: Normal breath sounds.  Abdominal:     General: Bowel sounds are normal.     Palpations: Abdomen is soft.  Genitourinary:    Comments: Unremarkable appearance of rectum, no active bleeding, no blood in rectal  vault Skin:    General: Skin is warm and dry.     Capillary Refill: Capillary refill takes less than 2 seconds.  Neurological:     Mental Status: She is alert and oriented to person, place, and time.  Psychiatric:        Mood and Affect: Mood normal.        Behavior: Behavior normal.     ED Results / Procedures / Treatments   Labs (all labs ordered are listed, but only abnormal results are displayed) Labs Reviewed  CBC WITH DIFFERENTIAL/PLATELET - Abnormal; Notable for the following components:      Result Value   WBC 3.7 (*)    Neutro Abs 1.1 (*)    All other components within normal limits  URINALYSIS, ROUTINE W REFLEX MICROSCOPIC  BASIC METABOLIC PANEL  PREGNANCY, URINE  POC OCCULT BLOOD, ED    EKG None  Radiology CT ABDOMEN PELVIS W CONTRAST Result Date:  05/04/2023 CLINICAL DATA:  Left lower quadrant pain EXAM: CT ABDOMEN AND PELVIS WITH CONTRAST TECHNIQUE: Multidetector CT imaging of the abdomen and pelvis was performed using the standard protocol following bolus administration of intravenous contrast. RADIATION DOSE REDUCTION: This exam was performed according to the departmental dose-optimization program which includes automated exposure control, adjustment of the mA and/or kV according to patient size and/or use of iterative reconstruction technique. CONTRAST:  75mL OMNIPAQUE IOHEXOL 350 MG/ML SOLN COMPARISON:  None Available. FINDINGS: Lower chest: No acute abnormality. Hepatobiliary: No focal liver abnormality is seen. No gallstones, gallbladder wall thickening, or biliary dilatation. Pancreas: Unremarkable. No pancreatic ductal dilatation or surrounding inflammatory changes. Spleen: Normal in size without focal abnormality. Adrenals/Urinary Tract: Adrenal glands are within normal limits. Kidneys demonstrate a enhancement pattern. No renal calculi or obstructive changes are seen. The bladder is partially distended. Stomach/Bowel: The appendix is well visualized. No obstructive or inflammatory changes of the colon are seen. Small bowel and stomach are within normal limits. Vascular/Lymphatic: No significant vascular findings are present. No enlarged abdominal or pelvic lymph nodes. Reproductive: Uterus and bilateral adnexa are unremarkable. Prominent periuterine venous structures are noted which may represent a degree of pelvic congestion. Other: No abdominal wall hernia or abnormality. No abdominopelvic ascites. Musculoskeletal: No acute or significant osseous findings. IMPRESSION: No acute abnormality to correspond with the given clinical history. Prominent uterine venous structures which may represent a degree of pelvic congestion. Electronically Signed   By: Alcide Clever M.D.   On: 05/04/2023 21:06    Procedures Procedures    Medications Ordered in  ED Medications  iohexol (OMNIPAQUE) 350 MG/ML injection 75 mL (75 mLs Intravenous Contrast Given 05/04/23 2041)    ED Course/ Medical Decision Making/ A&P                                 Medical Decision Making Amount and/or Complexity of Data Reviewed Radiology: ordered.   This patient is a 28 y.o. female  who presents to the ED for concern of abdominal pain, rectal bleeding.   Differential diagnoses prior to evaluation: The emergent differential diagnosis includes, but is not limited to, acute lower GI bleed, hemorrhoids, diverticulitis, diverticulosis. This is not an exhaustive differential.   Past Medical History / Co-morbidities / Social History: Overall noncontributory, past medical history seen for hypertension, anxiety, UTIs, recent pregnancy, 3 months postpartum.  Additional history: Chart reviewed. Pertinent results include: Overall noncontributory  Physical Exam: Physical exam performed. The pertinent findings  include: Benign rectal exam with no external or internal hemorrhoids palpated.  No rectal bleeding on my exam, no blood in rectal vault.  Lab Tests/Imaging studies: I personally interpreted labs/imaging and the pertinent results include: Negative pregnancy test, CBC overall unremarkable, mild thrombocytopenia, but blood cell 3.7, BMP unremarkable, Hemoccult negative, UA unremarkable.  I independently interpreted CT abdomen pelvis with contrast which shows some prominent pelvic vasculature consistent with pelvic congestion, no other acute abnormality to explain her rectal bleeding is noted.. I agree with the radiologist interpretation.   Disposition: After consideration of the diagnostic results and the patients response to treatment, I feel that patient with no evidence of anemia, benign rectal exam, benign CT, nonspecific GI bleeding, suspect likely secondary to internal hemorrhoids.  Encouraged stool softener, plenty of fluids, GI follow up as needed   emergency  department workup does not suggest an emergent condition requiring admission or immediate intervention beyond what has been performed at this time. The plan is: as above. The patient is safe for discharge and has been instructed to return immediately for worsening symptoms, change in symptoms or any other concerns. ' Final Clinical Impression(s) / ED Diagnoses Final diagnoses:  Rectal bleeding    Rx / DC Orders ED Discharge Orders          Ordered    docusate sodium (COLACE) 100 MG capsule  Every 12 hours        05/04/23 2134              [place] 05/04/23 2141    Glyn Ade, MD 05/04/23 2252

## 2023-05-04 NOTE — ED Triage Notes (Addendum)
Pt presents from home for rectal bleeding x 3 days. H/o bleeding with hemorrhoids in past but notes that has never been this heavy. Endorses back pain, nausea Denies emesis, fever Denies blood thinner She is 3 mo postpartum  Checked in yesterday and received labs for same, LWBS citing wait times

## 2023-05-04 NOTE — Discharge Instructions (Signed)
Your workup today was reassuring, I suspect that your rectal bleeding is secondary to hemorrhoids.  Please follow-up with the GI doctor whose contact information I provided above if you continue having significant rectal bleeding despite using Preparation H, and the constipation medication that I prescribed and drinking an appropriate amount of water.  Please return if you have significant worsening of your abdominal pain, rectal bleeding, especially if you are having significant amount of bleeding between bowel movements.

## 2023-05-04 NOTE — ED Provider Triage Note (Signed)
Emergency Medicine Provider Triage Evaluation Note  Jamie [name] , a 28 y.o. female  was evaluated in triage.  Pt complains of rectal bleeding, seen for same yesterday.  Review of Systems  Positive: BRB per rectum, pain after bowel movement, dizziness, chills Negative: Fever, cough. Abd pain, CP, SHOB, HA  Physical Exam  BP 127/81 (BP Location: Right Arm)   Pulse 80   Temp 98.7 F (37.1 C) (Oral)   Resp 16   Wt 66 kg   LMP 04/13/2023 (Approximate)   SpO2 100%   BMI 21.49 kg/m  Gen:   Awake, no distress   Resp:  Normal effort  MSK:   Moves extremities without difficulty  Other:  FDLMP Jan 19  Medical Decision Making  Medically screening exam initiated at 2:32 PM.  Appropriate orders placed.  Jamie [name] was informed that the remainder of the evaluation will be completed by another provider, this initial triage assessment does not replace that evaluation, and the importance of remaining in the ED until their evaluation is complete.  Labs ordered   Dolphus Jenny, PA-C 05/04/23 1435

## 2023-05-12 ENCOUNTER — Ambulatory Visit: Payer: Medicaid Other | Admitting: Obstetrics and Gynecology
